# Patient Record
Sex: Female | Born: 1985
Health system: Southern US, Community
[De-identification: ages and names within clinical notes are randomized; demographics above are authoritative.]

## PROBLEM LIST (undated history)

## (undated) DIAGNOSIS — F909 Attention-deficit hyperactivity disorder, unspecified type: Secondary | ICD-10-CM

## (undated) DIAGNOSIS — F419 Anxiety disorder, unspecified: Secondary | ICD-10-CM

## (undated) DIAGNOSIS — F32A Depression, unspecified: Secondary | ICD-10-CM

## (undated) DIAGNOSIS — M1990 Unspecified osteoarthritis, unspecified site: Secondary | ICD-10-CM

## (undated) DIAGNOSIS — K589 Irritable bowel syndrome without diarrhea: Secondary | ICD-10-CM

## (undated) HISTORY — PX: HERNIA REPAIR: SHX51

---

## 2018-03-26 ENCOUNTER — Other Ambulatory Visit: Payer: Self-pay

## 2018-03-26 ENCOUNTER — Emergency Department (HOSPITAL_COMMUNITY): Payer: Self-pay

## 2018-03-26 ENCOUNTER — Encounter (HOSPITAL_COMMUNITY): Payer: Self-pay | Admitting: Emergency Medicine

## 2018-03-26 ENCOUNTER — Emergency Department (HOSPITAL_COMMUNITY)
Admission: EM | Admit: 2018-03-26 | Discharge: 2018-03-27 | Disposition: A | Discharge status: Home / Self Care | Payer: Self-pay | Attending: Emergency Medicine | Admitting: Emergency Medicine

## 2018-03-26 DIAGNOSIS — K5902 Outlet dysfunction constipation: Secondary | ICD-10-CM

## 2018-03-26 DIAGNOSIS — R102 Pelvic and perineal pain: Secondary | ICD-10-CM

## 2018-03-26 DIAGNOSIS — N83201 Unspecified ovarian cyst, right side: Secondary | ICD-10-CM

## 2018-03-26 HISTORY — DX: Irritable bowel syndrome, unspecified: K58.9

## 2018-03-26 LAB — URINALYSIS, ROUTINE W REFLEX MICROSCOPIC
Bilirubin Urine: NEGATIVE
Glucose, UA: NEGATIVE mg/dL
HGB URINE DIPSTICK: NEGATIVE
Ketones, ur: NEGATIVE mg/dL
Leukocytes, UA: NEGATIVE
Nitrite: NEGATIVE
Protein, ur: 30 mg/dL — AB
Specific Gravity, Urine: 1.021 (ref 1.005–1.030)
pH: 8 (ref 5.0–8.0)

## 2018-03-26 LAB — I-STAT BETA HCG BLOOD, ED (MC, WL, AP ONLY): I-stat hCG, quantitative: 5 m[IU]/mL — ABNORMAL HIGH (ref <5)

## 2018-03-26 LAB — COMPREHENSIVE METABOLIC PANEL
ALT: 11 U/L (ref 0–44)
AST: 16 U/L (ref 15–41)
Albumin: 3.8 g/dL (ref 3.5–5.0)
Alkaline Phosphatase: 59 U/L (ref 38–126)
Anion gap: 4 — ABNORMAL LOW (ref 5–15)
BUN: <5 mg/dL — ABNORMAL LOW (ref 6–20)
CO2: 27 mmol/L (ref 22–32)
Calcium: 8.8 mg/dL — ABNORMAL LOW (ref 8.9–10.3)
Chloride: 105 mmol/L (ref 98–111)
Creatinine, Ser: 1 mg/dL (ref 0.44–1.00)
GFR calc Af Amer: >60 mL/min (ref >60)
Glucose, Bld: 94 mg/dL (ref 70–99)
Potassium: 3.7 mmol/L (ref 3.5–5.1)
Sodium: 136 mmol/L (ref 135–145)
Total Bilirubin: 1.1 mg/dL (ref 0.3–1.2)
Total Protein: 7 g/dL (ref 6.5–8.1)

## 2018-03-26 LAB — CBC
HCT: 40.4 % (ref 36.0–46.0)
Hemoglobin: 12.8 g/dL (ref 12.0–15.0)
MCH: 27.2 pg (ref 26.0–34.0)
MCHC: 31.7 g/dL (ref 30.0–36.0)
MCV: 85.8 fL (ref 80.0–100.0)
Platelets: 280 10*3/uL (ref 150–400)
RBC: 4.71 MIL/uL (ref 3.87–5.11)
RDW: 14.2 % (ref 11.5–15.5)
WBC: 14.7 10*3/uL — AB (ref 4.0–10.5)
nRBC: 0 % (ref 0.0–0.2)

## 2018-03-26 LAB — WET PREP, GENITAL
Sperm: NONE SEEN
Trich, Wet Prep: NONE SEEN
Yeast Wet Prep HPF POC: NONE SEEN

## 2018-03-26 LAB — LIPASE, BLOOD: LIPASE: 19 U/L (ref 11–51)

## 2018-03-26 MED ORDER — AZITHROMYCIN 250 MG PO TABS
1000.0000 mg | ORAL_TABLET | Freq: Once | ORAL | Status: AC
  Administered 2018-03-27: 1000 mg via ORAL
  Filled 2018-03-26: qty 4

## 2018-03-26 MED ORDER — CEFTRIAXONE SODIUM 250 MG IJ SOLR
250.0000 mg | Freq: Once | INTRAMUSCULAR | Status: AC
  Administered 2018-03-27: 250 mg via INTRAMUSCULAR
  Filled 2018-03-26: qty 250

## 2018-03-26 MED ORDER — FENTANYL CITRATE (PF) 100 MCG/2ML IJ SOLN
50.0000 ug | Freq: Once | INTRAMUSCULAR | Status: AC
  Administered 2018-03-26: 50 ug via INTRAVENOUS

## 2018-03-26 MED ORDER — FENTANYL CITRATE (PF) 100 MCG/2ML IJ SOLN
INTRAMUSCULAR | Status: AC
  Filled 2018-03-26: qty 2

## 2018-03-26 MED ORDER — IOHEXOL 300 MG/ML  SOLN
100.0000 mL | Freq: Once | INTRAMUSCULAR | Status: AC | PRN
  Administered 2018-03-26: 100 mL via INTRAVENOUS

## 2018-03-26 MED ORDER — FENTANYL CITRATE (PF) 100 MCG/2ML IJ SOLN
50.0000 ug | Freq: Once | INTRAMUSCULAR | Status: AC
  Administered 2018-03-26: 50 ug via INTRAVENOUS
  Filled 2018-03-26: qty 2

## 2018-03-26 MED ORDER — ONDANSETRON HCL 4 MG/2ML IJ SOLN
4.0000 mg | Freq: Once | INTRAMUSCULAR | Status: AC
  Administered 2018-03-26: 4 mg via INTRAVENOUS
  Filled 2018-03-26: qty 2

## 2018-03-26 NOTE — ED Provider Notes (Signed)
[facility] EMERGENCY DEPARTMENT Provider Note   CSN: [id number] Arrival date & time: 03/26/18  1633     History   Chief Complaint Chief Complaint  Patient presents with  . Abdominal Pain    HPI Joanna Smith is a 32 y.o. female.  The history is provided by the patient. No language interpreter was used.   Joanna Smith is a 32 y.o. female who presents to the Emergency Department complaining of abdominal pain. He presents to the emergency department complaining of abdominal pain that began earlier today. She reports right lower quadrant pain described as dull and constant nature. At times it radiates down her right leg. Pain is worse with movement. He has associated nausea but no vomiting. She does report constipation for the last week. No fevers, vomiting, vaginal discharge. She is sexually active with her boyfriend but uses protection. She does have a history of recurrent BV and has been self treating at home with boric acid suppositories. She states that she gets severe yeast infections if she ever takes antibiotics. LMP two weeks ago Past Medical History:  Diagnosis Date  . IBS (irritable bowel syndrome)     There are no active problems to display for this patient.   Past Surgical History:  Procedure Laterality Date  . HERNIA REPAIR       OB History   No obstetric history on file.      Home Medications    Prior to Admission medications   Medication Sig Start Date End Date Taking? Authorizing Provider  doxycycline (VIBRAMYCIN) 100 MG capsule Take 1 capsule (100 mg total) by mouth 2 (two) times daily. 03/27/18   Quintella Reichert, MD  fluconazole (DIFLUCAN) 150 MG tablet Take 1 tablet (150 mg total) by mouth every 3 (three) days for 3 doses. 03/27/18 04/03/18  Quintella Reichert, MD  HYDROcodone-acetaminophen (NORCO/VICODIN) 5-325 MG tablet Take 1 tablet by mouth every 6 (six) hours as needed for severe pain. 03/27/18   Quintella Reichert, MD  ibuprofen  (ADVIL,MOTRIN) 800 MG tablet Take 1 tablet (800 mg total) by mouth every 8 (eight) hours as needed. 03/27/18   Quintella Reichert, MD  metroNIDAZOLE (FLAGYL) 500 MG tablet Take 1 tablet (500 mg total) by mouth 2 (two) times daily. 03/27/18   Quintella Reichert, MD  ondansetron (ZOFRAN) 4 MG tablet Take 1 tablet (4 mg total) by mouth every 8 (eight) hours as needed for nausea or vomiting. 03/27/18   Quintella Reichert, MD    Family History No family history on file.  Social History Social History   Tobacco Use  . Smoking status: Never Smoker  . Smokeless tobacco: Never Used  Substance Use Topics  . Alcohol use: Never    Frequency: Never  . Drug use: Yes    Types: Marijuana     Allergies   Macadamia nut oil   Review of Systems Review of Systems  All other systems reviewed and are negative.    Physical Exam Updated Vital Signs BP 96/74   Pulse 87   Temp 98.6 F (37 C) (Oral)   Resp 16   LMP 03/14/2018   SpO2 100%   Physical Exam Vitals signs and nursing note reviewed.  Constitutional:      Appearance: She is well-developed.  HENT:     Head: Normocephalic and atraumatic.  Cardiovascular:     Rate and Rhythm: Normal rate and regular rhythm.     Heart sounds: No murmur.  Pulmonary:     Effort: Pulmonary effort  is normal. No respiratory distress.     Breath sounds: Normal breath sounds.  Abdominal:     Palpations: Abdomen is soft.     Tenderness: There is no guarding or rebound.     Comments: Moderate right lower quadrant tenderness, voluntary guarding, no rebound.  Genitourinary:    Comments: Scan white vaginally discharge. No CMT. There is right adnexal tenderness. Musculoskeletal:        General: No tenderness.  Skin:    General: Skin is warm and dry.  Neurological:     Mental Status: She is alert and oriented to person, place, and time.  Psychiatric:        Behavior: Behavior normal.      ED Treatments / Results  Labs (all labs ordered are listed, but  only abnormal results are displayed) Labs Reviewed  WET PREP, GENITAL - Abnormal; Notable for the following components:      Result Value   Clue Cells Wet Prep HPF POC PRESENT (*)    WBC, Wet Prep HPF POC MODERATE (*)    All other components within normal limits  COMPREHENSIVE METABOLIC PANEL - Abnormal; Notable for the following components:   BUN <5 (*)    Calcium 8.8 (*)    Anion gap 4 (*)    All other components within normal limits  CBC - Abnormal; Notable for the following components:   WBC 14.7 (*)    All other components within normal limits  URINALYSIS, ROUTINE W REFLEX MICROSCOPIC - Abnormal; Notable for the following components:   APPearance HAZY (*)    Protein, ur 30 (*)    Bacteria, UA RARE (*)    All other components within normal limits  I-STAT BETA HCG BLOOD, ED ([facility], [facility], [facility] ONLY) - Abnormal; Notable for the following components:   I-stat hCG, quantitative 5.0 (*)    All other components within normal limits  LIPASE, BLOOD  RPR  HIV ANTIBODY (ROUTINE TESTING W REFLEX)  GC/CHLAMYDIA PROBE AMP ([place]) NOT AT [facility]    EKG None  Radiology US Transvaginal Non-ob  Result Date: 03/26/2018 CLINICAL DATA:  Initial evaluation for acute pelvic pain. EXAM: TRANSABDOMINAL AND TRANSVAGINAL ULTRASOUND OF PELVIS DOPPLER ULTRASOUND OF OVARIES TECHNIQUE: Both transabdominal and transvaginal ultrasound examinations of the pelvis were performed. Transabdominal technique was performed for global imaging of the pelvis including uterus, ovaries, adnexal regions, and pelvic cul-de-sac. It was necessary to proceed with endovaginal exam following the transabdominal exam to visualize the uterus, endometrium, and ovaries. Color and duplex Doppler ultrasound was utilized to evaluate blood flow to the ovaries. COMPARISON:  Prior CT from earlier the same day. FINDINGS: Uterus Measurements: 7.3 x 9.9 x 5.6 cm = volume: 214.7 mL. Multiple heterogeneous fibroid seen arising from the uterus. 6 cm  subserosal fibroid extends from the posterior uterine fundus. 5.9 cm subserosal fibroid extends anteriorly from the mid uterine body. Endometrium Thickness: 5.6 mm.  No focal abnormality visualized. Right ovary Not definitely visualized. Left ovary Measurements: 5.6 x 3.0 x 3.8 cm = volume: 35.3 mL. Few small simple anechoic cyst present, largest of which measures approximately 1.2 cm, most consistent with physiologic cysts/dominant follicles. Adjacent serpiginous tubular cystic structure most suggestive of a small hydrosalpinx (image 36). In the posterior pelvic cul-de-sac, a large complex cystic structure measuring up to 8.9 cm is seen, corresponding with cystic lesion on prior CT. This demonstrates diffuse homogeneous low-level internal echoes. No appreciable internal solid component. Unclear whether this arises from the ovaries, as this is  difficult to assess given size. Pulsed Doppler evaluation of the left ovary demonstrates normal low-resistance arterial and venous waveforms. The right ovary not visualized. Other findings No appreciable free fluid within the pelvis. IMPRESSION: 1. Large complex cystic mass measuring up to 8.9 cm within the posterior pelvic cul-de-sac. Finding is indeterminate, and not entirely clear whether this is ovarian in origin. Possible ovarian considerations would include a large complex or hemorrhagic cyst, endometrioma, or cystic ovarian neoplasm. Gynecologic referral for surgical consultation and further workup recommended. Additionally, further assessment with dedicated MRI may be helpful for further evaluation. 2. Additional mildly complex fluid-filled tubular structures within the left adnexa, most suggestive of hydrosalpinx. 3. No evidence for left-sided ovarian torsion. 4. Nonvisualization of the right ovary. 5. Fibroid uterus as above. Electronically Signed   By: Jeannine Boga M.D.   On: 03/26/2018 23:35   US Pelvis Complete  Result Date: 03/26/2018 CLINICAL DATA:   Initial evaluation for acute pelvic pain. EXAM: TRANSABDOMINAL AND TRANSVAGINAL ULTRASOUND OF PELVIS DOPPLER ULTRASOUND OF OVARIES TECHNIQUE: Both transabdominal and transvaginal ultrasound examinations of the pelvis were performed. Transabdominal technique was performed for global imaging of the pelvis including uterus, ovaries, adnexal regions, and pelvic cul-de-sac. It was necessary to proceed with endovaginal exam following the transabdominal exam to visualize the uterus, endometrium, and ovaries. Color and duplex Doppler ultrasound was utilized to evaluate blood flow to the ovaries. COMPARISON:  Prior CT from earlier the same day. FINDINGS: Uterus Measurements: 7.3 x 9.9 x 5.6 cm = volume: 214.7 mL. Multiple heterogeneous fibroid seen arising from the uterus. 6 cm subserosal fibroid extends from the posterior uterine fundus. 5.9 cm subserosal fibroid extends anteriorly from the mid uterine body. Endometrium Thickness: 5.6 mm.  No focal abnormality visualized. Right ovary Not definitely visualized. Left ovary Measurements: 5.6 x 3.0 x 3.8 cm = volume: 35.3 mL. Few small simple anechoic cyst present, largest of which measures approximately 1.2 cm, most consistent with physiologic cysts/dominant follicles. Adjacent serpiginous tubular cystic structure most suggestive of a small hydrosalpinx (image 36). In the posterior pelvic cul-de-sac, a large complex cystic structure measuring up to 8.9 cm is seen, corresponding with cystic lesion on prior CT. This demonstrates diffuse homogeneous low-level internal echoes. No appreciable internal solid component. Unclear whether this arises from the ovaries, as this is difficult to assess given size. Pulsed Doppler evaluation of the left ovary demonstrates normal low-resistance arterial and venous waveforms. The right ovary not visualized. Other findings No appreciable free fluid within the pelvis. IMPRESSION: 1. Large complex cystic mass measuring up to 8.9 cm within the  posterior pelvic cul-de-sac. Finding is indeterminate, and not entirely clear whether this is ovarian in origin. Possible ovarian considerations would include a large complex or hemorrhagic cyst, endometrioma, or cystic ovarian neoplasm. Gynecologic referral for surgical consultation and further workup recommended. Additionally, further assessment with dedicated MRI may be helpful for further evaluation. 2. Additional mildly complex fluid-filled tubular structures within the left adnexa, most suggestive of hydrosalpinx. 3. No evidence for left-sided ovarian torsion. 4. Nonvisualization of the right ovary. 5. Fibroid uterus as above. Electronically Signed   By: Jeannine Boga M.D.   On: 03/26/2018 23:35   Ct Abdomen Pelvis W Contrast  Result Date: 03/26/2018 CLINICAL DATA:  Abdominal pain, appendicitis suspected. EXAM: CT ABDOMEN AND PELVIS WITH CONTRAST TECHNIQUE: Multidetector CT imaging of the abdomen and pelvis was performed using the standard protocol following bolus administration of intravenous contrast. CONTRAST:  126mL OMNIPAQUE IOHEXOL 300 MG/ML  SOLN COMPARISON:  None.  FINDINGS: Lower chest: Normal heart size without pericardial effusion. Clear lung bases. Hepatobiliary: No focal liver abnormality is seen. No gallstones, gallbladder wall thickening, or biliary dilatation. Pancreas: No pancreatic ductal dilatation or inflammation. Spleen: Normal in size without focal abnormality. Adrenals/Urinary Tract: Adrenal glands are unremarkable. Kidneys are normal, without renal calculi, focal lesion, or hydronephrosis. Bladder is unremarkable. Stomach/Bowel: The appendix is visualized and is normal in caliber, series 3/56. No bowel obstruction or inflammation. Nondistended stomach with normal small bowel rotation. Vascular/Lymphatic: No adenopathy. Nonaneurysmal abdominal aorta. Reproductive: A complex cystic mass with thin septations is identified just ventral to the sacrum, seen within the cul-de-sac.  The dominant cystic component measures approximately 8.1 x 8.3 x 9.2 cm with smaller cystic foci projecting off the posterior caudal aspect. No solid mural nodularity is apparent. Tubular fluid-filled densities are noted in both adnexa suspicious for hydrosalpinges. Subserosal uterine leiomyomata are identified the largest is off the posterior aspect of the uterine fundus measuring 5.6 x 5.2 x 6.2 cm with a more heterogeneous subserosal uterine body leiomyoma measuring 7.7 x 4.5 x 5.5 cm. Other: No free air free fluid. Musculoskeletal: No acute nor aggressive osseous lesions. Slight disc space narrowing at L5-S1. IMPRESSION: 1. Complex cystic mass in the cul-de-sac measuring 8.1 x 8.3 x 9.2 cm with thin septations and smaller cystic foci projecting off the posterior caudal aspect. This is likely of ovarian etiology. Per consensus guidelines, ultrasound correlation is recommended. 2. Tubular fluid-filled densities in both adnexa suspicious for hydrosalpinges. 3. Fibroid uterus. 4. Normal appendix.  No bowel obstruction or inflammation. Electronically Signed   By: Ashley Royalty M.D.   On: 03/26/2018 19:33   [place] Art/ven Flow Abd Pelv Doppler  Result Date: 03/26/2018 CLINICAL DATA:  Initial evaluation for acute pelvic pain. EXAM: TRANSABDOMINAL AND TRANSVAGINAL ULTRASOUND OF PELVIS DOPPLER ULTRASOUND OF OVARIES TECHNIQUE: Both transabdominal and transvaginal ultrasound examinations of the pelvis were performed. Transabdominal technique was performed for global imaging of the pelvis including uterus, ovaries, adnexal regions, and pelvic cul-de-sac. It was necessary to proceed with endovaginal exam following the transabdominal exam to visualize the uterus, endometrium, and ovaries. Color and duplex Doppler ultrasound was utilized to evaluate blood flow to the ovaries. COMPARISON:  Prior CT from earlier the same day. FINDINGS: Uterus Measurements: 7.3 x 9.9 x 5.6 cm = volume: 214.7 mL. Multiple heterogeneous fibroid seen  arising from the uterus. 6 cm subserosal fibroid extends from the posterior uterine fundus. 5.9 cm subserosal fibroid extends anteriorly from the mid uterine body. Endometrium Thickness: 5.6 mm.  No focal abnormality visualized. Right ovary Not definitely visualized. Left ovary Measurements: 5.6 x 3.0 x 3.8 cm = volume: 35.3 mL. Few small simple anechoic cyst present, largest of which measures approximately 1.2 cm, most consistent with physiologic cysts/dominant follicles. Adjacent serpiginous tubular cystic structure most suggestive of a small hydrosalpinx (image 36). In the posterior pelvic cul-de-sac, a large complex cystic structure measuring up to 8.9 cm is seen, corresponding with cystic lesion on prior CT. This demonstrates diffuse homogeneous low-level internal echoes. No appreciable internal solid component. Unclear whether this arises from the ovaries, as this is difficult to assess given size. Pulsed Doppler evaluation of the left ovary demonstrates normal low-resistance arterial and venous waveforms. The right ovary not visualized. Other findings No appreciable free fluid within the pelvis. IMPRESSION: 1. Large complex cystic mass measuring up to 8.9 cm within the posterior pelvic cul-de-sac. Finding is indeterminate, and not entirely clear whether this is ovarian in origin. Possible ovarian considerations  would include a large complex or hemorrhagic cyst, endometrioma, or cystic ovarian neoplasm. Gynecologic referral for surgical consultation and further workup recommended. Additionally, further assessment with dedicated MRI may be helpful for further evaluation. 2. Additional mildly complex fluid-filled tubular structures within the left adnexa, most suggestive of hydrosalpinx. 3. No evidence for left-sided ovarian torsion. 4. Nonvisualization of the right ovary. 5. Fibroid uterus as above. Electronically Signed   By: Jeannine Boga M.D.   On: 03/26/2018 23:35    Procedures Procedures  (including critical care time)  Medications Ordered in ED Medications  fentaNYL (SUBLIMAZE) injection 50 mcg (50 mcg Intravenous Given 03/26/18 1843)  ondansetron (ZOFRAN) injection 4 mg (4 mg Intravenous Given 03/26/18 1843)  iohexol (OMNIPAQUE) 300 MG/ML solution 100 mL (100 mLs Intravenous Contrast Given 03/26/18 1901)  fentaNYL (SUBLIMAZE) injection 50 mcg (50 mcg Intravenous Given 03/26/18 2117)  azithromycin (ZITHROMAX) tablet 1,000 mg (1,000 mg Oral Given 03/27/18 0037)  cefTRIAXone (ROCEPHIN) injection 250 mg (250 mg Intramuscular Given 03/27/18 0038)  fluconazole (DIFLUCAN) tablet 150 mg (150 mg Oral Given 03/27/18 0046)     Initial Impression / Assessment and Plan / ED Course  I have reviewed the triage vital signs and the nursing notes.  Pertinent labs & imaging results that were available during my care of the patient were reviewed by me and considered in my medical decision making (see chart for details).     Patient here for evaluation of abdominal pain, nausea, constipation. She does have tenderness on examination but is non-toxic appearing. CT abdomen demonstrates a pelvic mass. Pelvic ultrasound with large cystic structure concerning for ovarian cyst. Pelvic examination with scant discharge. Discussed with OB/GYN on-call as well as patient findings of studies. OB/GYN recommends treatment for possible chronic PID with antibiotics. Discussed with patient concerns for infection and she is hesitant to take antibiotics due to a history of recurrent yeast infections, will provide Diflucan. Discussed home care for constipation. Discussed importance of outpatient follow-up as well as return precautions.  After discussion of sound findings patient does report that she has a history of pelvic/rectal cyst four years ago that was diagnosed at another hospital. She never received follow-up for this is that time.  Final Clinical Impressions(s) / ED Diagnoses   Final diagnoses:  Pelvic  pain  Constipation due to outlet dysfunction  Cyst of right ovary    ED Discharge Orders         Ordered    doxycycline (VIBRAMYCIN) 100 MG capsule  2 times daily     03/27/18 0026    metroNIDAZOLE (FLAGYL) 500 MG tablet  2 times daily     03/27/18 0026    ibuprofen (ADVIL,MOTRIN) 800 MG tablet  Every 8 hours PRN     03/27/18 0026    ondansetron (ZOFRAN) 4 MG tablet  Every 8 hours PRN     03/27/18 0026    HYDROcodone-acetaminophen (NORCO/VICODIN) 5-325 MG tablet  Every 6 hours PRN     03/27/18 0026    fluconazole (DIFLUCAN) 150 MG tablet  every 72 hours     03/27/18 0027           Quintella Reichert, MD 03/27/18 [phone]

## 2018-03-26 NOTE — ED Triage Notes (Signed)
Pt reports dull abdominal pain x 1 week. Pt reports she has not had a bowel movement/passed gas in 1 week. Pt reports nausea, vomiting, and chills starting today. Pt denies sick contact, reports hx of IBS and bowel blockage in past.

## 2018-03-27 ENCOUNTER — Telehealth: Payer: Self-pay | Admitting: Obstetrics and Gynecology

## 2018-03-27 LAB — GC/CHLAMYDIA PROBE AMP (~~LOC~~) NOT AT ARMC
Chlamydia: NEGATIVE
Neisseria Gonorrhea: NEGATIVE

## 2018-03-27 LAB — HIV ANTIBODY (ROUTINE TESTING W REFLEX): HIV SCREEN 4TH GENERATION: NONREACTIVE

## 2018-03-27 LAB — RPR: RPR Ser Ql: NONREACTIVE

## 2018-03-27 MED ORDER — METRONIDAZOLE 500 MG PO TABS: 500.0000 mg | ORAL_TABLET | Freq: Two times a day (BID) | ORAL | 0 refills | Status: DC

## 2018-03-27 MED ORDER — FLUCONAZOLE 150 MG PO TABS
150.0000 mg | ORAL_TABLET | Freq: Once | ORAL | Status: AC
  Administered 2018-03-27: 150 mg via ORAL
  Filled 2018-03-27: qty 1

## 2018-03-27 MED ORDER — ONDANSETRON HCL 4 MG PO TABS: 4.0000 mg | ORAL_TABLET | Freq: Three times a day (TID) | ORAL | 0 refills | Status: DC | PRN

## 2018-03-27 MED ORDER — DOXYCYCLINE HYCLATE 100 MG PO CAPS: 100.0000 mg | ORAL_CAPSULE | Freq: Two times a day (BID) | ORAL | 0 refills | Status: DC

## 2018-03-27 MED ORDER — FLUCONAZOLE 150 MG PO TABS: 150.0000 mg | ORAL_TABLET | ORAL | 0 refills | Status: AC

## 2018-03-27 MED ORDER — IBUPROFEN 800 MG PO TABS: 800.0000 mg | ORAL_TABLET | Freq: Three times a day (TID) | ORAL | 0 refills | Status: DC | PRN

## 2018-03-27 MED ORDER — HYDROCODONE-ACETAMINOPHEN 5-325 MG PO TABS: 1.0000 | ORAL_TABLET | Freq: Four times a day (QID) | ORAL | 0 refills | Status: DC | PRN

## 2018-03-27 NOTE — Telephone Encounter (Signed)
Informed patient of upcoming appointment @9 :15am via telephone call.

## 2018-03-27 NOTE — Discharge Instructions (Signed)
You had a CT scan and ultrasound today that demonstrated an 8.9 cm cyst on your right ovary. Please follow-up with the women's clinic for further evaluation. Get rechecked immediately if you develop severe fevers or new concerning symptoms.  You can take miralax, available over the counter for constipation.

## 2018-04-02 ENCOUNTER — Encounter: Payer: Self-pay | Admitting: Obstetrics and Gynecology

## 2018-04-02 ENCOUNTER — Ambulatory Visit (INDEPENDENT_AMBULATORY_CARE_PROVIDER_SITE_OTHER): Payer: Self-pay | Admitting: Obstetrics and Gynecology

## 2018-04-02 ENCOUNTER — Other Ambulatory Visit (HOSPITAL_COMMUNITY)
Admission: RE | Admit: 2018-04-02 | Discharge: 2018-04-02 | Disposition: A | Discharge status: Home / Self Care | Payer: Medicaid Other | Source: Ambulatory Visit | Attending: Obstetrics and Gynecology | Admitting: Obstetrics and Gynecology

## 2018-04-02 ENCOUNTER — Telehealth: Payer: Self-pay

## 2018-04-02 DIAGNOSIS — D219 Benign neoplasm of connective and other soft tissue, unspecified: Secondary | ICD-10-CM | POA: Insufficient documentation

## 2018-04-02 DIAGNOSIS — N83202 Unspecified ovarian cyst, left side: Secondary | ICD-10-CM | POA: Insufficient documentation

## 2018-04-02 DIAGNOSIS — D259 Leiomyoma of uterus, unspecified: Secondary | ICD-10-CM | POA: Diagnosis not present

## 2018-04-02 DIAGNOSIS — N83299 Other ovarian cyst, unspecified side: Secondary | ICD-10-CM | POA: Insufficient documentation

## 2018-04-02 NOTE — Progress Notes (Signed)
Joanna Smith is 33 yo G0 here for ER follow up for ovarian cyst and uterine fibroids noted on recent CT scan and U/S. Pt reports no pain today. Monthly cycles, last 3-4 days, no heavy or painful. Sexual active, condoms. H/O abnormal pap and colpo in the past. Last pap unsure Nulligravid + STD in teen yrs  PE AF VSS Lungs clear Heart RRR Abd soft + BS GU Nl EGBUS white vaginal discharge no lesion pap smear obtained uterus enlarged deviated to the left fullness noted in left adnexal  A/P Left Ovarian cyst        Uterine fibroids  Pap today for HM. Ovarian cyst and uterine fibroids reviewed with pt. Will check GYN U/S in 6-8 weeks. F/U after U/S to review results and additional treatment as indicated

## 2018-04-02 NOTE — Telephone Encounter (Signed)
Called Pt to inform of scheduled [place] appt. On 04/19/18 @ 9:30a & to arrive at 9:15am. Pt verbalized understanding.

## 2018-04-06 LAB — CYTOLOGY - PAP
Diagnosis: NEGATIVE
HPV: NOT DETECTED

## 2018-04-07 ENCOUNTER — Telehealth: Payer: Self-pay

## 2018-04-07 NOTE — Telephone Encounter (Signed)
-----   Message from Chancy Milroy, MD sent at 04/07/2018  2:15 PM EST ----- Please let pt know that her pap smear was normal Thanks Legrand Como

## 2018-04-07 NOTE — Telephone Encounter (Signed)
Notified pt that her pap results are normal.  Pt stated thank you with no further questions.

## 2018-04-09 ENCOUNTER — Ambulatory Visit (HOSPITAL_COMMUNITY)
Admission: RE | Admit: 2018-04-09 | Discharge: 2018-04-09 | Disposition: A | Discharge status: Home / Self Care | Payer: Self-pay | Source: Ambulatory Visit | Attending: Obstetrics and Gynecology | Admitting: Obstetrics and Gynecology

## 2018-04-09 DIAGNOSIS — D219 Benign neoplasm of connective and other soft tissue, unspecified: Secondary | ICD-10-CM | POA: Insufficient documentation

## 2018-04-09 DIAGNOSIS — N83299 Other ovarian cyst, unspecified side: Secondary | ICD-10-CM | POA: Insufficient documentation

## 2018-04-10 ENCOUNTER — Telehealth: Payer: Self-pay | Admitting: *Deleted

## 2018-04-10 NOTE — Telephone Encounter (Signed)
-----   Message from Chancy Milroy, MD sent at 04/10/2018  9:00 AM EST ----- Please let Ms Little know that the ovarian cyst has resolved. Fibroids remain. Keep follow up appt with me next month to discuss Thanks Legrand Como

## 2018-04-10 NOTE — Telephone Encounter (Signed)
Akhila called and left a voicemail at 11:52 stating she wanted to talk with Caryl Pina, the nurse she just spoke with- states still has a few questions.

## 2018-04-10 NOTE — Telephone Encounter (Signed)
Called pt to inform that her ovarian cyst has resolved and the fibroids remain and that she needs to keep her follow up appointment with Dr. Rip Harbour to discuss the plan for her fibroids.  Pt stated "how y'all going to tell me that my cyst is gone when I saw it myself yesterday on the ultrasound?! What are you going to do if I go to another doctor, in another city, and get another ultrasound and it shows the cyst?!" Advised pt that she is welcome to seek a second opinion and can let us know at a later time if she is interested in keeping her follow up appointment.  Pt stated that she "thinks we are playing her and trying to get rid of her by telling her there isn't a cyst when there is one and that we are just trying not to deal with this."  Reassured pt that this was not the case and that we take her concerns very seriously but the ultrasound does not show a cyst from the report given as well as from the MD review.  Pt verbalized understanding and stated that she would seek a second opinion.

## 2018-04-14 NOTE — Telephone Encounter (Signed)
Contacted pt and pt asked if she could a copy of her [place] results.  I advised pt that she can get them via Mychart she will just need to use a web browser and if she can't then she can fill out a release of information to obtain any of her records.  Pt stated understanding with on further questions.

## 2018-05-22 ENCOUNTER — Ambulatory Visit: Payer: Medicaid Other | Admitting: Obstetrics and Gynecology

## 2018-11-25 ENCOUNTER — Ambulatory Visit (HOSPITAL_COMMUNITY)
Admission: EM | Admit: 2018-11-25 | Discharge: 2018-11-25 | Disposition: A | Discharge status: Home / Self Care | Payer: Self-pay | Attending: Emergency Medicine | Admitting: Emergency Medicine

## 2018-11-25 ENCOUNTER — Other Ambulatory Visit: Payer: Self-pay

## 2018-11-25 ENCOUNTER — Encounter (HOSPITAL_COMMUNITY): Payer: Self-pay

## 2018-11-25 DIAGNOSIS — N61 Mastitis without abscess: Secondary | ICD-10-CM

## 2018-11-25 MED ORDER — IBUPROFEN 600 MG PO TABS: 600.0000 mg | ORAL_TABLET | Freq: Four times a day (QID) | ORAL | 0 refills | Status: DC | PRN

## 2018-11-25 MED ORDER — DOXYCYCLINE HYCLATE 100 MG PO CAPS: 100.0000 mg | ORAL_CAPSULE | Freq: Two times a day (BID) | ORAL | 0 refills | Status: AC

## 2018-11-25 NOTE — Discharge Instructions (Signed)
Finish the doxycycline, even if you feel better.  Warm compresses.  Feel free to return here if you are not better after finishing the antibiotics.  You may take 600 mg ibuprofen combined with 1 g Tylenol together 3-4 times a day as needed for pain.  Otherwise, follow-up with primary care physician choice, see list below.  Below is a list of primary care practices who are taking new patients for you to follow-up with.  [facility] [address] [place], [place] [address] [phone]  [place], [place] [address] [phone]  [facility] [phone] [place] [address]  [facility]: [place]  [phone]  [place] [facility]: [place]   [phone]  [facility]: [address] [place]  [phone]  [facility] : [address] [phone]  [facility]: [address] [place] [phone]  [facility]: [place] [phone]  Dr. Blanchie Serve [place]  [phone]  Dr. Benito Mccreedy, [facility]. [place], [place] [address]  [phone]  Go to www.goodrx.com to look up your medications. This will give you a list of where you can find your prescriptions at the most affordable prices. Or ask the pharmacist what the cash price is, or if they have any other discount programs available to help make your medication more affordable. This can be less expensive than what you would pay with  insurance.

## 2018-11-25 NOTE — ED Triage Notes (Signed)
Pt states her right nipple is draining and its very sore. X 3 days. Pt states the discharge is green, yellow and bloody in color.

## 2018-11-25 NOTE — ED Provider Notes (Signed)
HPI  SUBJECTIVE:  Joanna Smith is a 33 y.o. female who presents with several days of constant right nipple tenderness and pain.  She expressed odorous purulent thick yellow-green discharge from her nipple today, and the pain improved.  She states that she has been "leaking" serous drainage and blood after expressing the pus.  She reports that the nipple is swollen, and reports swelling in the left upper quadrant of her right breast.  There are no alleviating factors.  She has not tried anything for this.  Symptoms are worse with palpation.  She is not breast-feeding.  No fevers, body aches, axillary pain.  She has a nipple piercing that is several years old, is currently not wearing any jewelry.  She denies pus coming from the piercing tract.  Past medical history negative for diabetes, MRSA.  She does not take any medications on a regular basis.  LMP: 8/2.  Denies the possibility being pregnant.  PMD: None.    History reviewed. No pertinent past medical history.  History reviewed. No pertinent surgical history.  History reviewed. No pertinent family history.  Social History   Tobacco Use  . Smoking status: Never Smoker  . Smokeless tobacco: Never Used  Substance Use Topics  . Alcohol use: Not on file  . Drug use: Not on file    No current facility-administered medications for this encounter.   Current Outpatient Medications:  .  doxycycline (VIBRAMYCIN) 100 MG capsule, Take 1 capsule (100 mg total) by mouth 2 (two) times daily for 5 days., Disp: 10 capsule, Rfl: 0 .  ibuprofen (ADVIL) 600 MG tablet, Take 1 tablet (600 mg total) by mouth every 6 (six) hours as needed., Disp: 30 tablet, Rfl: 0  No Known Allergies   ROS  As noted in HPI.   Physical Exam  BP 111/66 (BP Location: Right Arm)   Pulse 88   Temp 97.7 F (36.5 C)   Resp 18   Wt 74.8 kg   LMP 11/01/2018   SpO2 99%   Constitutional: Well developed, well nourished, no acute distress Eyes:  EOMI, conjunctiva  normal bilaterally HENT: Normocephalic, atraumatic,mucus membranes moist Respiratory: Normal inspiratory effort Cardiovascular: Normal rate GI: nondistended skin: Breasts symmetric.  Mild tenderness in the left upper quadrant of the right breast.  No induration, erythema of the breast or of the nipple.  Scant expressible purulent discharge from the nipple.  No discharge from the piercing tract.  No palpable breast masses.  No skin changes.  Patient declined chaperone Lymph: No axillary lymphadenopathy right side Musculoskeletal: no deformities Neurologic: Alert & oriented x 3, no focal neuro deficits Psychiatric: Speech and behavior appropriate   ED Course   Medications - No data to display  No orders of the defined types were placed in this encounter.   No results found for this or any previous visit (from the past 24 hour(s)). No results found.  ED Clinical Impression  1. Nipple infection      ED Assessment/Plan  Suspect patient had a small abscess that she expressed.  However will send home with doxycycline, warm compresses, Tylenol/ibuprofen.  Doubt galactocele as she is not pregnant/breast-feeding nor is she on any psychiatric medications that would cause lactation.  Will provide primary care list for ongoing care.  May return here if not better in 5 days for reevaluation.  Discussed , MDM, treatment plan, and plan for follow-up with patient.. patient agrees with plan.   Meds ordered this encounter  Medications  . doxycycline (VIBRAMYCIN)  100 MG capsule    Sig: Take 1 capsule (100 mg total) by mouth 2 (two) times daily for 5 days.    Dispense:  10 capsule    Refill:  0  . ibuprofen (ADVIL) 600 MG tablet    Sig: Take 1 tablet (600 mg total) by mouth every 6 (six) hours as needed.    Dispense:  30 tablet    Refill:  0    *This clinic note was created using [employer]. Therefore, there may be occasional mistakes despite careful proofreading.   ?    Melynda Ripple, MD 11/26/18 [phone]

## 2018-11-26 ENCOUNTER — Encounter: Payer: Self-pay | Admitting: Obstetrics and Gynecology

## 2018-11-30 ENCOUNTER — Emergency Department (HOSPITAL_COMMUNITY)
Admission: EM | Admit: 2018-11-30 | Discharge: 2018-12-01 | Disposition: A | Discharge status: Home / Self Care | Payer: Medicaid Other | Attending: Emergency Medicine | Admitting: Emergency Medicine

## 2018-11-30 ENCOUNTER — Other Ambulatory Visit: Payer: Self-pay

## 2018-11-30 DIAGNOSIS — F1210 Cannabis abuse, uncomplicated: Secondary | ICD-10-CM | POA: Insufficient documentation

## 2018-11-30 DIAGNOSIS — R21 Rash and other nonspecific skin eruption: Secondary | ICD-10-CM

## 2018-11-30 NOTE — ED Triage Notes (Signed)
Patient c/o rash on torso that began about a week ago. Also c/o itching. Has been taking Benadryl, hydrocortisone, zyrtec, etc,  with no results.

## 2018-12-01 MED ORDER — HYDROXYZINE HCL 25 MG PO TABS: 25.0000 mg | ORAL_TABLET | Freq: Four times a day (QID) | ORAL | 0 refills | Status: DC

## 2018-12-01 NOTE — Discharge Instructions (Addendum)
Follow up with the dermatologist of your choice for further evaluation of nonspecific rash.

## 2018-12-01 NOTE — ED Provider Notes (Signed)
[facility] Provider Note   CSN: [id number] Arrival date & time: 11/30/18  2345     History   Chief Complaint Chief Complaint  Patient presents with  . Rash    HPI Joanna Smith is a 33 y.o. female.     Patient to ED for evaluation of itching rash to torso for 2 weeks. No one else in the house is affected. No fever, blisters, drainage. She is using oral and topical Benadryl, topical hydrocortisone, Zyrtec, all without relief and she feels the rash is worsening. No new medications. No SOB, wheezing, oral swelling or trouble swallowing.   The history is provided by the patient. No language interpreter was used.  Rash Associated symptoms: no fever, no shortness of breath and not wheezing     Past Medical History:  Diagnosis Date  . IBS (irritable bowel syndrome)     Patient Active Problem List   Diagnosis Date Noted  . Ovarian cyst, complex 04/02/2018  . Fibroids 04/02/2018    Past Surgical History:  Procedure Laterality Date  . HERNIA REPAIR       OB History   No obstetric history on file.      Home Medications    Prior to Admission medications   Medication Sig Start Date End Date Taking? Authorizing Provider  doxycycline (VIBRAMYCIN) 100 MG capsule Take 1 capsule (100 mg total) by mouth 2 (two) times daily. 03/27/18   Quintella Reichert, MD  HYDROcodone-acetaminophen (NORCO/VICODIN) 5-325 MG tablet Take 1 tablet by mouth every 6 (six) hours as needed for severe pain. Patient not taking: Reported on 04/02/2018 03/27/18   Quintella Reichert, MD  ibuprofen (ADVIL) 600 MG tablet Take 1 tablet (600 mg total) by mouth every 6 (six) hours as needed. 11/25/18   Melynda Ripple, MD  ibuprofen (ADVIL,MOTRIN) 800 MG tablet Take 1 tablet (800 mg total) by mouth every 8 (eight) hours as needed. 03/27/18   Quintella Reichert, MD  metroNIDAZOLE (FLAGYL) 500 MG tablet Take 1 tablet (500 mg total) by mouth 2 (two) times daily. 03/27/18   Quintella Reichert, MD  ondansetron (ZOFRAN) 4 MG tablet Take 1 tablet (4 mg total) by mouth every 8 (eight) hours as needed for nausea or vomiting. Patient not taking: Reported on 04/02/2018 03/27/18   Quintella Reichert, MD    Family History No family history on file.  Social History Social History   Tobacco Use  . Smoking status: Never Smoker  . Smokeless tobacco: Never Used  Substance Use Topics  . Alcohol use: Never    Frequency: Never  . Drug use: Yes    Types: Marijuana     Allergies   Macadamia nut oil   Review of Systems Review of Systems  Constitutional: Negative for fever.  HENT: Negative for trouble swallowing.   Respiratory: Negative for shortness of breath and wheezing.   Skin: Positive for rash.     Physical Exam Updated Vital Signs BP 120/85 (BP Location: Right Arm)   Pulse 70   Temp 98 F (36.7 C) (Oral)   Resp 18   Ht 5\' 2"  (1.575 m)   Wt 74.8 kg   LMP 11/01/2018   SpO2 100%   BMI 30.18 kg/m   Physical Exam Constitutional:      Appearance: She is well-developed.  Neck:     Musculoskeletal: Normal range of motion.  Pulmonary:     Effort: Pulmonary effort is normal.  Skin:    General: Skin is warm and dry.  Comments: Small, singular, raised minimally erythematous bumps generalized to back and abdomen/chest. There are few areas to proximal extremities. No herald patch. No confluence.  Neurological:     Mental Status: She is alert and oriented to person, place, and time.      ED Treatments / Results  Labs (all labs ordered are listed, but only abnormal results are displayed) Labs Reviewed - No data to display  EKG None  Radiology No results found.  Procedures Procedures (including critical care time)  Medications Ordered in ED Medications - No data to display   Initial Impression / Assessment and Plan / ED Course  I have reviewed the triage vital signs and the nursing notes.  Pertinent labs & imaging results that were available  during my care of the patient were reviewed by me and considered in my medical decision making (see chart for details).        Patient with nonspecific rash to torso x 2 weeks. Does not follow the pattern of allergy, pityriasis, scabies or infection.   Will Rx Atarax for symptoms and refer to dermatology  Final Clinical Impressions(s) / ED Diagnoses   Final diagnoses:  None   1. Nonspecific rash  ED Discharge Orders    None       Charlann Lange, PA-C 12/01/18 0144    Ripley Fraise, MD 12/01/18 [phone]

## 2019-01-11 ENCOUNTER — Other Ambulatory Visit: Payer: Self-pay

## 2019-01-11 ENCOUNTER — Emergency Department (HOSPITAL_COMMUNITY): Payer: Self-pay

## 2019-01-11 ENCOUNTER — Inpatient Hospital Stay (HOSPITAL_COMMUNITY)
Admission: EM | Admit: 2019-01-11 | Discharge: 2019-01-15 | DRG: 872 | Disposition: A | Discharge status: Home / Self Care | Payer: Self-pay | Attending: Internal Medicine | Admitting: Internal Medicine

## 2019-01-11 ENCOUNTER — Encounter (HOSPITAL_COMMUNITY): Payer: Self-pay | Admitting: *Deleted

## 2019-01-11 DIAGNOSIS — B9789 Other viral agents as the cause of diseases classified elsewhere: Secondary | ICD-10-CM | POA: Diagnosis present

## 2019-01-11 DIAGNOSIS — F1210 Cannabis abuse, uncomplicated: Secondary | ICD-10-CM | POA: Diagnosis present

## 2019-01-11 DIAGNOSIS — K5900 Constipation, unspecified: Secondary | ICD-10-CM | POA: Diagnosis present

## 2019-01-11 DIAGNOSIS — N730 Acute parametritis and pelvic cellulitis: Secondary | ICD-10-CM | POA: Diagnosis present

## 2019-01-11 DIAGNOSIS — J069 Acute upper respiratory infection, unspecified: Secondary | ICD-10-CM | POA: Diagnosis present

## 2019-01-11 DIAGNOSIS — D259 Leiomyoma of uterus, unspecified: Secondary | ICD-10-CM | POA: Diagnosis present

## 2019-01-11 DIAGNOSIS — N7093 Salpingitis and oophoritis, unspecified: Secondary | ICD-10-CM | POA: Diagnosis present

## 2019-01-11 DIAGNOSIS — Z7982 Long term (current) use of aspirin: Secondary | ICD-10-CM

## 2019-01-11 DIAGNOSIS — E876 Hypokalemia: Secondary | ICD-10-CM | POA: Diagnosis present

## 2019-01-11 DIAGNOSIS — Z87891 Personal history of nicotine dependence: Secondary | ICD-10-CM

## 2019-01-11 DIAGNOSIS — K2970 Gastritis, unspecified, without bleeding: Secondary | ICD-10-CM | POA: Diagnosis present

## 2019-01-11 DIAGNOSIS — Z20822 Contact with and (suspected) exposure to covid-19: Secondary | ICD-10-CM

## 2019-01-11 DIAGNOSIS — Z20828 Contact with and (suspected) exposure to other viral communicable diseases: Secondary | ICD-10-CM | POA: Diagnosis present

## 2019-01-11 DIAGNOSIS — N7011 Chronic salpingitis: Secondary | ICD-10-CM | POA: Diagnosis present

## 2019-01-11 DIAGNOSIS — A419 Sepsis, unspecified organism: Principal | ICD-10-CM | POA: Diagnosis present

## 2019-01-11 DIAGNOSIS — Z833 Family history of diabetes mellitus: Secondary | ICD-10-CM

## 2019-01-11 LAB — CBC WITH DIFFERENTIAL/PLATELET
Abs Immature Granulocytes: 0.16 10*3/uL — ABNORMAL HIGH (ref 0.00–0.07)
Basophils Absolute: 0.1 10*3/uL (ref 0.0–0.1)
Basophils Relative: 0 %
Eosinophils Absolute: 0 10*3/uL (ref 0.0–0.5)
Eosinophils Relative: 0 %
HCT: 42 % (ref 36.0–46.0)
Hemoglobin: 13.5 g/dL (ref 12.0–15.0)
Immature Granulocytes: 1 %
Lymphocytes Relative: 11 %
Lymphs Abs: 2.7 10*3/uL (ref 0.7–4.0)
MCH: 27.7 pg (ref 26.0–34.0)
MCHC: 32.1 g/dL (ref 30.0–36.0)
MCV: 86.1 fL (ref 80.0–100.0)
Monocytes Absolute: 2.3 10*3/uL — ABNORMAL HIGH (ref 0.1–1.0)
Monocytes Relative: 10 %
Neutro Abs: 18.7 10*3/uL — ABNORMAL HIGH (ref 1.7–7.7)
Neutrophils Relative %: 78 %
Platelets: 261 10*3/uL (ref 150–400)
RBC: 4.88 MIL/uL (ref 3.87–5.11)
RDW: 14.3 % (ref 11.5–15.5)
WBC: 23.9 10*3/uL — ABNORMAL HIGH (ref 4.0–10.5)
nRBC: 0 % (ref 0.0–0.2)

## 2019-01-11 LAB — URINALYSIS, ROUTINE W REFLEX MICROSCOPIC
Bacteria, UA: NONE SEEN
Bilirubin Urine: NEGATIVE
Glucose, UA: NEGATIVE mg/dL
Hgb urine dipstick: NEGATIVE
Ketones, ur: NEGATIVE mg/dL
Nitrite: NEGATIVE
Protein, ur: NEGATIVE mg/dL
Specific Gravity, Urine: 1.008 (ref 1.005–1.030)
pH: 7 (ref 5.0–8.0)

## 2019-01-11 LAB — RESPIRATORY PANEL BY PCR

## 2019-01-11 LAB — COMPREHENSIVE METABOLIC PANEL WITH GFR
ALT: 11 U/L (ref 0–44)
AST: 13 U/L — ABNORMAL LOW (ref 15–41)
Albumin: 3.5 g/dL (ref 3.5–5.0)
Alkaline Phosphatase: 72 U/L (ref 38–126)
Anion gap: 10 (ref 5–15)
BUN: 7 mg/dL (ref 6–20)
CO2: 22 mmol/L (ref 22–32)
Calcium: 8.6 mg/dL — ABNORMAL LOW (ref 8.9–10.3)
Chloride: 104 mmol/L (ref 98–111)
Creatinine, Ser: 0.73 mg/dL (ref 0.44–1.00)
GFR calc Af Amer: >60 mL/min
GFR calc non Af Amer: >60 mL/min
Glucose, Bld: 101 mg/dL — ABNORMAL HIGH (ref 70–99)
Potassium: 3.8 mmol/L (ref 3.5–5.1)
Sodium: 136 mmol/L (ref 135–145)
Total Bilirubin: 0.7 mg/dL (ref 0.3–1.2)
Total Protein: 7.1 g/dL (ref 6.5–8.1)

## 2019-01-11 LAB — WET PREP, GENITAL
Clue Cells Wet Prep HPF POC: NONE SEEN
Sperm: NONE SEEN
Trich, Wet Prep: NONE SEEN
Yeast Wet Prep HPF POC: NONE SEEN

## 2019-01-11 LAB — PREGNANCY, URINE: Preg Test, Ur: NEGATIVE

## 2019-01-11 LAB — SARS CORONAVIRUS 2 (TAT 6-24 HRS): SARS Coronavirus 2: NEGATIVE

## 2019-01-11 LAB — INFLUENZA PANEL BY PCR (TYPE A & B)
Influenza A By PCR: NEGATIVE
Influenza B By PCR: NEGATIVE

## 2019-01-11 LAB — LACTIC ACID, PLASMA: Lactic Acid, Venous: 0.7 mmol/L (ref 0.5–1.9)

## 2019-01-11 LAB — LIPASE, BLOOD: Lipase: 24 U/L (ref 11–51)

## 2019-01-11 MED ORDER — DOXYCYCLINE HYCLATE 100 MG PO TABS
100.0000 mg | ORAL_TABLET | Freq: Once | ORAL | Status: AC
  Administered 2019-01-11: 19:00:00 100 mg via ORAL
  Filled 2019-01-11: qty 1

## 2019-01-11 MED ORDER — SODIUM CHLORIDE 0.9 % IV BOLUS
1000.0000 mL | Freq: Once | INTRAVENOUS | Status: AC
  Administered 2019-01-11: 1000 mL via INTRAVENOUS

## 2019-01-11 MED ORDER — ONDANSETRON HCL 4 MG/2ML IJ SOLN
4.0000 mg | Freq: Once | INTRAMUSCULAR | Status: AC
  Administered 2019-01-11: 4 mg via INTRAVENOUS
  Filled 2019-01-11: qty 2

## 2019-01-11 MED ORDER — MORPHINE SULFATE (PF) 4 MG/ML IV SOLN
4.0000 mg | Freq: Once | INTRAVENOUS | Status: AC
  Administered 2019-01-11: 15:00:00 4 mg via INTRAVENOUS
  Filled 2019-01-11: qty 1

## 2019-01-11 MED ORDER — ONDANSETRON HCL 4 MG/2ML IJ SOLN
4.0000 mg | Freq: Four times a day (QID) | INTRAMUSCULAR | Status: DC | PRN
  Administered 2019-01-11 – 2019-01-13 (×5): 4 mg via INTRAVENOUS
  Filled 2019-01-11 (×7): qty 2

## 2019-01-11 MED ORDER — IOHEXOL 300 MG/ML  SOLN
100.0000 mL | Freq: Once | INTRAMUSCULAR | Status: AC | PRN
  Administered 2019-01-11: 100 mL via INTRAVENOUS

## 2019-01-11 MED ORDER — SODIUM CHLORIDE 0.9 % IV BOLUS
500.0000 mL | Freq: Once | INTRAVENOUS | Status: AC
  Administered 2019-01-11: 500 mL via INTRAVENOUS

## 2019-01-11 MED ORDER — BENZONATATE 100 MG PO CAPS
100.0000 mg | ORAL_CAPSULE | Freq: Once | ORAL | Status: AC
  Administered 2019-01-11: 100 mg via ORAL
  Filled 2019-01-11: qty 1

## 2019-01-11 MED ORDER — HYDROMORPHONE HCL 1 MG/ML IJ SOLN
0.5000 mg | INTRAMUSCULAR | Status: AC | PRN
  Administered 2019-01-11 (×2): 0.5 mg via INTRAVENOUS
  Filled 2019-01-11 (×2): qty 1

## 2019-01-11 MED ORDER — IOHEXOL 300 MG/ML  SOLN: 100.0000 mL | Freq: Once | INTRAMUSCULAR | Status: DC | PRN

## 2019-01-11 MED ORDER — SODIUM CHLORIDE 0.9 % IV SOLN
2.0000 g | Freq: Once | INTRAVENOUS | Status: AC
  Administered 2019-01-11: 19:00:00 2 g via INTRAVENOUS
  Filled 2019-01-11: qty 2

## 2019-01-11 MED ORDER — MORPHINE SULFATE (PF) 4 MG/ML IV SOLN
4.0000 mg | Freq: Once | INTRAVENOUS | Status: AC
  Administered 2019-01-11: 4 mg via INTRAVENOUS
  Filled 2019-01-11: qty 1

## 2019-01-11 NOTE — H&P (Signed)
Joanna Smith [id number] DOB: 1986-03-22 DOA: 01/11/2019     PCP: Patient, No Pcp Per   Outpatient Specialists:  NONE  *  Patient arrived to ER on 01/11/19 at 1111  Patient coming from: home Lives  With fiance    Chief Complaint:   Chief Complaint  Patient presents with   Abdominal Pain    HPI: Joanna Smith is a 33 y.o. female with   medical history significant  Of PID, TOA    Presented with   abdominal pain congestion no nausea vomiting no diarrhea has had significant cough for past 4 days occasionally productive of sputum she had initially cold-like symptoms last week but now settled into her chest.  She has occasional chest pain with coughing.  No leg swelling no hemoptysis no personal history of PE or DVT. Patient denied any sick contacts she try to use Zyrtec but did not seem to help Having fevers last night and feels like her abdomen right lower quadrant pain feels similar to when she had fluid in her fallopian tubes in the past no vaginal discharge  Patient initially stated to emergency department that [facility] virus testing has already virus on them and that she felt "set up because she is a black woman" for being tested for coronavirus She eventually agreed to get tested   Infectious risk factors:  Reports:  Fever,  cough, chest pain,  URI symptoms,  abdominal pain,      In  ER RAPID COVID TEST NEGATIVE   Lab Results  Component Value Date   [place] NEGATIVE 01/11/2019       While in ER: CT abdomen showed evidence of PID as well as left hydrosalpinx and 9.5 cm complex cystic lesion of pelvic cul-de-sac likely secondary to chronic left tubo-ovarian abscess OB/GYN was consulted requesting medicine to admit given respiratory symptoms although patient is COVID negative ER provider is concerned and would like patient to be retested in a.m. Later on respiratory panel came back with rhinovirus which is likely explaining patient's respiratory  symptoms   OB/GYN will see patient in consult recommend an ultrasound to further evaluate if patient will need aggressive surgical intervention IV antibiotics cefotaxime and doxycycline initiated in emergency The following Work up has been ordered so far:  Orders Placed This Encounter  Procedures   SARS CORONAVIRUS 2 (TAT 6-24 HRS) Nasopharyngeal Nasopharyngeal Swab   Wet prep, genital   Culture, blood (routine x 2)   Respiratory Panel by PCR   DG Chest Port 1 View   CT Abdomen Pelvis W Contrast   US Pelvis Complete   US Transvaginal Non-OB   US Art/Ven Flow Abd Pelv Doppler   Comprehensive metabolic panel   Lipase, blood   CBC with Differential   Urinalysis, Routine w reflex microscopic   Pregnancy, urine   Influenza panel by PCR (type A & B)   Cardiac monitoring   Check temperature   Pelvic cart to bedside   Consult to obstetrics / gynecology  ALL PATIENTS BEING ADMITTED/HAVING PROCEDURES NEED COVID-19 SCREENING   Consult to hospitalist  ALL PATIENTS BEING ADMITTED/HAVING PROCEDURES NEED COVID-19 SCREENING   Airborne and Contact precautions   Pulse oximetry, continuous   Saline lock IV     Following Medications were ordered in ER: Medications  iohexol (OMNIPAQUE) 300 MG/ML solution 100 mL ( Intravenous Canceled Entry 01/11/19 1656)  HYDROmorphone (DILAUDID) injection 0.5 mg (0.5 mg Intravenous Given 01/11/19 1806)  cefOXitin (MEFOXIN) 2 g in sodium chloride 0.9 %  100 mL IVPB (2 g Intravenous New Bag/Given 01/11/19 1901)  morphine 4 MG/ML injection 4 mg (4 mg Intravenous Given 01/11/19 1315)  ondansetron (ZOFRAN) injection 4 mg (4 mg Intravenous Given 01/11/19 1315)  sodium chloride 0.9 % bolus 500 mL (0 mLs Intravenous Stopped 01/11/19 1428)  benzonatate (TESSALON) capsule 100 mg (100 mg Oral Given 01/11/19 1527)  morphine 4 MG/ML injection 4 mg (4 mg Intravenous Given 01/11/19 1528)  iohexol (OMNIPAQUE) 300 MG/ML solution 100 mL (100 mLs  Intravenous Contrast Given 01/11/19 1656)  sodium chloride 0.9 % bolus 1,000 mL (1,000 mLs Intravenous New Bag/Given 01/11/19 1809)  doxycycline (VIBRA-TABS) tablet 100 mg (100 mg Oral Given 01/11/19 1857)        Consult Orders  (From admission, onward)         Start     Ordered   01/11/19 1826  Consult to hospitalist  ALL PATIENTS BEING ADMITTED/HAVING PROCEDURES NEED COVID-19 SCREENING  Once    Comments: ALL PATIENTS BEING ADMITTED/HAVING PROCEDURES NEED COVID-19 SCREENING  Provider:  (Not yet assigned)  Question Answer Comment  Place call to: [facility]   Reason for Consult Admit      01/11/19 1825          ER Provider Called:   OB/GYN   Dr. Rosana Hoes They Recommend admit to medicine   Will see in ER   Significant initial  Findings: Abnormal Labs Reviewed  WET PREP, GENITAL - Abnormal; Notable for the following components:      Result Value   WBC, Wet Prep HPF POC FEW (*)    All other components within normal limits  COMPREHENSIVE METABOLIC PANEL - Abnormal; Notable for the following components:   Glucose, Bld 101 (*)    Calcium 8.6 (*)    AST 13 (*)    All other components within normal limits  CBC WITH DIFFERENTIAL/PLATELET - Abnormal; Notable for the following components:   WBC 23.9 (*)    Neutro Abs 18.7 (*)    Monocytes Absolute 2.3 (*)    Abs Immature Granulocytes 0.16 (*)    All other components within normal limits  URINALYSIS, ROUTINE W REFLEX MICROSCOPIC - Abnormal; Notable for the following components:   APPearance HAZY (*)    Leukocytes,Ua TRACE (*)    All other components within normal limits     Otherwise labs showing:    Recent Labs  Lab 01/11/19 1324  NA 136  K 3.8  CO2 22  GLUCOSE 101*  BUN 7  CREATININE 0.73  CALCIUM 8.6*    Cr    stable,   Lab Results  Component Value Date   CREATININE 0.73 01/11/2019   CREATININE 1.00 03/26/2018    Recent Labs  Lab 01/11/19 1324  AST 13*  ALT 11  ALKPHOS 72  BILITOT 0.7  PROT  7.1  ALBUMIN 3.5   Lab Results  Component Value Date   CALCIUM 8.6 (L) 01/11/2019     WBC      Component Value Date/Time   WBC 23.9 (H) 01/11/2019 1324   ANC    Component Value Date/Time   NEUTROABS 18.7 (H) 01/11/2019 1324   ALC No components found for: LYMPHAB    Plt: Lab Results  Component Value Date   PLT 261 01/11/2019     Lactic Acid, Venous    Component Value Date/Time   LATICACIDVEN 0.7 01/11/2019 1324    Procalcitonin    Ordered   COVID-19 Labs    Lab Results  Component Value  Date   [place] NEGATIVE 01/11/2019     HG/HCT stable,     Component Value Date/Time   HGB 13.5 01/11/2019 1324   HCT 42.0 01/11/2019 1324    Recent Labs  Lab 01/11/19 1324  LIPASE 24   No results for input(s): AMMONIA in the last 168 hours.  No components found for: LABALBU      UA  no evidence of UTI    Urine analysis:    Component Value Date/Time   COLORURINE YELLOW 01/11/2019 1427   APPEARANCEUR HAZY (A) 01/11/2019 1427   LABSPEC 1.008 01/11/2019 1427   PHURINE 7.0 01/11/2019 1427   GLUCOSEU NEGATIVE 01/11/2019 [place] 01/11/2019 1427   BILIRUBINUR NEGATIVE 01/11/2019 1427   KETONESUR NEGATIVE 01/11/2019 1427   PROTEINUR NEGATIVE 01/11/2019 1427   NITRITE NEGATIVE 01/11/2019 1427   LEUKOCYTESUR TRACE (A) 01/11/2019 1427    Ordered    CXR -  NON acute  CTabd/pelvis - right hydrosalpinx with mild adjacent inflammatory changes, consistent with acute pelvic inflammatory disease.    ED Triage Vitals  Enc Vitals Group     BP 01/11/19 1118 (!) 147/84     Pulse Rate 01/11/19 1118 (!) 105     Resp 01/11/19 1118 16     Temp 01/11/19 1118 98.9 F (37.2 C)     Temp Source 01/11/19 1118 Oral     SpO2 01/11/19 1118 99 %     Weight 01/11/19 1143 160 lb (72.6 kg)     Height 01/11/19 1143 5\' 2"  (1.575 m)     Head Circumference --      Peak Flow --      Pain Score 01/11/19 1143 8     Pain Loc --      Pain Edu? --      Excl. in [place]?  --   TMAX(24)@       Latest  Blood pressure 117/66, pulse 93, temperature 97.8 F (36.6 C), temperature source Oral, resp. rate 14, height 5\' 2"  (1.575 m), weight 72.6 kg, last menstrual period 01/04/2019, SpO2 100 %.     Hospitalist was called for admission for PID and respiratory illness    Review of Systems:    Pertinent positives include:  Fevers, chills, fatigue,  abdominal pain, shortness of breath at rest.  productive cough, Constitutional:  No weight loss, night sweatsweight loss  HEENT:  No headaches, Difficulty swallowing,Tooth/dental problems,Sore throat,  No sneezing, itching, ear ache, nasal congestion, post nasal drip,  Cardio-vascular:  No chest pain, Orthopnea, PND, anasarca, dizziness, palpitations.no Bilateral lower extremity swelling  GI:  No heartburn, indigestion,  nausea, vomiting, diarrhea, change in bowel habits, loss of appetite, melena, blood in stool, hematemesis Resp:   No dyspnea on exertion, No excess mucus, no No non-productive cough, No coughing up of blood.No change in color of mucus.No wheezing. Skin:  no rash or lesions. No jaundice GU:  no dysuria, change in color of urine, no urgency or frequency. No straining to urinate.  No flank pain.  Musculoskeletal:  No joint pain or no joint swelling. No decreased range of motion. No back pain.  Psych:  No change in mood or affect. No depression or anxiety. No memory loss.  Neuro: no localizing neurological complaints, no tingling, no weakness, no double vision, no gait abnormality, no slurred speech, no confusion  All systems reviewed and apart from [place] all are negative  Past Medical History:   Past Medical History:  Diagnosis Date   IBS (  irritable bowel syndrome)       Past Surgical History:  Procedure Laterality Date   HERNIA REPAIR      Social History:  Ambulatory   independently       reports that she has been smoking. She has never used smokeless tobacco. She reports  previous drug use. Drug: Marijuana. She reports that she does not drink alcohol.   Family History:   Family History  Problem Relation Age of Onset   Diabetes Other     Allergies: Allergies  Allergen Reactions   Macadamia Nut Oil Anaphylaxis     Prior to Admission medications   Medication Sig Start Date End Date Taking? Authorizing Provider  doxycycline (VIBRAMYCIN) 100 MG capsule Take 1 capsule (100 mg total) by mouth 2 (two) times daily. 03/27/18   Quintella Reichert, MD  HYDROcodone-acetaminophen (NORCO/VICODIN) 5-325 MG tablet Take 1 tablet by mouth every 6 (six) hours as needed for severe pain. Patient not taking: Reported on 04/02/2018 03/27/18   Quintella Reichert, MD  hydrOXYzine (ATARAX/VISTARIL) 25 MG tablet Take 1 tablet (25 mg total) by mouth every 6 (six) hours. 12/01/18   Charlann Lange, PA-C  ibuprofen (ADVIL) 600 MG tablet Take 1 tablet (600 mg total) by mouth every 6 (six) hours as needed. 11/25/18   Melynda Ripple, MD  ibuprofen (ADVIL,MOTRIN) 800 MG tablet Take 1 tablet (800 mg total) by mouth every 8 (eight) hours as needed. 03/27/18   Quintella Reichert, MD  metroNIDAZOLE (FLAGYL) 500 MG tablet Take 1 tablet (500 mg total) by mouth 2 (two) times daily. 03/27/18   Quintella Reichert, MD  ondansetron (ZOFRAN) 4 MG tablet Take 1 tablet (4 mg total) by mouth every 8 (eight) hours as needed for nausea or vomiting. Patient not taking: Reported on 04/02/2018 03/27/18   Quintella Reichert, MD   Physical Exam: Blood pressure 117/66, pulse 93, temperature 97.8 F (36.6 C), temperature source Oral, resp. rate 14, height 5\' 2"  (1.575 m), weight 72.6 kg, last menstrual period 01/04/2019, SpO2 100 %. 1. General:  in No  Acute distress    Chronically ill -appearing 2. Psychological: Alert and   Oriented 3. Head/ENT:     Dry Mucous Membranes                          Head Non traumatic, neck supple                            Poor Dentition 4. SKIN:  decreased Skin turgor,  Skin clean Dry and  intact no rash 5. Heart: Regular rate and rhythm no  Murmur, no Rub or gallop 6. Lungs: no wheezes or crackles   7. Abdomen: Soft,  RLQ tenderness, Non distended  bowel sounds present 8. Lower extremities: no clubbing, cyanosis, no  edema 9. Neurologically Grossly intact, moving all 4 extremities equally   10. MSK: Normal range of motion   All other LABS:     Recent Labs  Lab 01/11/19 1324  WBC 23.9*  NEUTROABS 18.7*  HGB 13.5  HCT 42.0  MCV 86.1  PLT 261     Recent Labs  Lab 01/11/19 1324  NA 136  K 3.8  CL 104  CO2 22  GLUCOSE 101*  BUN 7  CREATININE 0.73  CALCIUM 8.6*     Recent Labs  Lab 01/11/19 1324  AST 13*  ALT 11  ALKPHOS 72  BILITOT 0.7  PROT 7.1  ALBUMIN 3.5       Cultures: No results found for: SDES, SPECREQUEST, CULT, REPTSTATUS   Radiological Exams on Admission: Ct Abdomen Pelvis W Contrast  Result Date: 01/11/2019 CLINICAL DATA:  Acute onset abdominal pain beginning last night. Suspected appendicitis. EXAM: CT ABDOMEN AND PELVIS WITH CONTRAST TECHNIQUE: Multidetector CT imaging of the abdomen and pelvis was performed using the standard protocol following bolus administration of intravenous contrast. CONTRAST:  111mL OMNIPAQUE IOHEXOL 300 MG/ML SOLN, <See Chart> OMNIPAQUE IOHEXOL 300 MG/ML SOLN COMPARISON:  03/26/2018 FINDINGS: Lower Chest: No acute findings. Hepatobiliary: No hepatic masses identified. Gallbladder is unremarkable. No evidence of biliary ductal dilatation. Pancreas:  No mass or inflammatory changes. Spleen: Within normal limits in size and appearance. Adrenals/Urinary Tract: No masses identified. No evidence of hydronephrosis. Stomach/Bowel: No evidence of obstruction, inflammatory process or abnormal fluid collections. Normal appendix visualized. Vascular/Lymphatic: No pathologically enlarged lymph nodes. No abdominal aortic aneurysm. Reproductive: Several uterine fibroids are again seen. Largest in the anterior lower uterine  segment measures 9 cm in maximum diameter. Another fibroid in the fundus measures 6 cm and shows diffuse hypervascular enhancement. Right-sided hydrosalpinx shows mild increase in size, with increased mural enhancement and mild adjacent inflammatory changes, consistent with tubo-ovarian abscess. Left hydrosalpinx shows no significant change. A complex cystic lesion which contains several enhancing internal septations is again seen in the pelvic cul-de-sac, currently measuring 9.5 x 7.7 cm compared to 9.2 x 7.9 cm previously. This likely represents a chronic left tubo-ovarian abscess, with cystic neoplasm of the left ovary considered less likely. No evidence of free fluid. Other:  None. Musculoskeletal:  No suspicious bone lesions identified. IMPRESSION: Mild increase in size of right hydrosalpinx with mild adjacent inflammatory changes, consistent with acute pelvic inflammatory disease. Stable left hydrosalpinx and 9.5 cm complex cystic lesion in pelvic cul-de-sac, likely representing chronic left tubo-ovarian abscess, with cystic neoplasm of left ovary considered less likely. Multiple uterine fibroids. No evidence of appendicitis. Electronically Signed   By: Marlaine Hind M.D.   On: 01/11/2019 17:22   Dg Chest Port 1 View  Result Date: 01/11/2019 CLINICAL DATA:  Tachycardia with cough and fevers EXAM: PORTABLE CHEST 1 VIEW COMPARISON:  None. FINDINGS: Lungs are clear. Heart size and pulmonary vascularity are normal. No adenopathy. No pneumothorax. No bone lesions. IMPRESSION: No edema or consolidation. Electronically Signed   By: Lowella Grip III M.D.   On: 01/11/2019 13:41    Chart has been reviewed    Assessment/Plan   32 y.o. female with   medical history significant  Of PID, TOA  Admitted for PID  Present on Admission:  PID (acute pelvic inflammatory disease) - continue Cefoxitin 2g q 6h  and doxycycline 100 BID [place] ordered , keep NPO post midnight in case need a procedure OB will  follow   Upper Respiratory illness - likely due to Rhinovirus, no evidence of PNA, Covid is negative supportive care  History of marijuana abuse.  Encourage patient to discontinue at this point she is not interested  Other plan as per orders.  DVT prophylaxis:  SCD    Code Status:  FULL CODE   as per patient   I had personally discussed CODE STATUS with patient   Family Communication:   Family not at  Bedside    Disposition Plan:     To home once workup is complete and patient is stable  Consults called: OB/GYN  Admission status:  ED Disposition    None       inpatient     Expect 2 midnight stay secondary to severity of patient's current illness including    Severe lab/radiological/exam abnormalities including:  PID   and extensive comorbidities including:   substance abuse    That are currently affecting medical management.   I expect  patient to be hospitalized for 2 midnights requiring inpatient medical care.  Patient is at high risk for adverse outcome (such as loss of life or disability) if not treated.  Indication for inpatient stay as follows:    severe pain requiring acute inpatient management,  inability to maintain oral hydration post midnight   Need for operative/procedural  intervention    Need for IV antibiotics, IV fluids,      Level of care    medical floor    Precautions:   Droplet precaution  PPE: Used by the provider:   P100  eye Goggles,  Gloves      [place] 01/11/2019, 9:32 PM *   [facility]     after 2 AM please page floor coverage PA If 7AM-7PM, please contact the day team taking care of the patient using [email or web address]

## 2019-01-11 NOTE — ED Notes (Signed)
ED TO INPATIENT HANDOFF REPORT  ED Nurse Name and Phone #:  Lonn Georgia [phone]  S Name/Age/Gender Joanna Smith 33 y.o. female Room/Bed: 010C/010C  Code Status   Code Status: Not on file  Home/SNF/Other Home Patient oriented to: self, place, time and situation Is this baseline? Yes   Triage Complete: Triage complete  Chief Complaint abd pain and congestion  Triage Note C/o abd. Pain onset last pm c/o congestion . Last BM yest. Denies urinary sx. Denies vomiting.    Allergies Allergies  Allergen Reactions  . Macadamia Nut Oil Anaphylaxis    Level of Care/Admitting Diagnosis ED Disposition    ED Disposition Condition Comment   Admit  Hospital Area: [facility] [[id number]]  Level of Care: Med-Surg [16]  Covid Evaluation: Person Under Investigation (PUI)  Diagnosis: PID (acute pelvic inflammatory disease) [id number]  Admitting Physician: Toy Baker [[id number]]  Attending Physician: Toy Baker [[id number]]  Estimated length of stay: 3 - 4 days  Certification:: I certify this patient will need inpatient services for at least 2 midnights  PT Class (Do Not Modify): Inpatient [101]  PT Acc Code (Do Not Modify): Private [1]       B Medical/Surgery History Past Medical History:  Diagnosis Date  . IBS (irritable bowel syndrome)    Past Surgical History:  Procedure Laterality Date  . HERNIA REPAIR       A IV Location/Drains/Wounds Patient Lines/Drains/Airways Status   Active Line/Drains/Airways    Name:   Placement date:   Placement time:   Site:   Days:   Peripheral IV 01/11/19 Right Antecubital   01/11/19    1320    Antecubital   less than 1          Intake/Output Last 24 hours  Intake/Output Summary (Last 24 hours) at 01/11/2019 2042 Last data filed at 01/11/2019 1428 Gross per 24 hour  Intake 500 ml  Output -  Net 500 ml    Labs/Imaging Results for orders placed or performed during the hospital encounter of 01/11/19 (from the  past 48 hour(s))  Comprehensive metabolic panel     Status: Abnormal   Collection Time: 01/11/19  1:24 PM  Result Value Ref Range   Sodium 136 135 - 145 mmol/L   Potassium 3.8 3.5 - 5.1 mmol/L   Chloride 104 98 - 111 mmol/L   CO2 22 22 - 32 mmol/L   Glucose, Bld 101 (H) 70 - 99 mg/dL   BUN 7 6 - 20 mg/dL   Creatinine, Ser 0.73 0.44 - 1.00 mg/dL   Calcium 8.6 (L) 8.9 - 10.3 mg/dL   Total Protein 7.1 6.5 - 8.1 g/dL   Albumin 3.5 3.5 - 5.0 g/dL   AST 13 (L) 15 - 41 U/L   ALT 11 0 - 44 U/L   Alkaline Phosphatase 72 38 - 126 U/L   Total Bilirubin 0.7 0.3 - 1.2 mg/dL   GFR calc non Af Amer >60 >60 mL/min   GFR calc Af Amer >60 >60 mL/min   Anion gap 10 5 - 15    Comment: Performed at [facility], [address] [place], [place] [address]  Lipase, blood     Status: None   Collection Time: 01/11/19  1:24 PM  Result Value Ref Range   Lipase 24 11 - 51 U/L    Comment: Performed at [facility] [address], [place], [place] [address]  CBC with Differential     Status: Abnormal  Collection Time: 01/11/19  1:24 PM  Result Value Ref Range   WBC 23.9 (H) 4.0 - 10.5 K/uL   RBC 4.88 3.87 - 5.11 MIL/uL   Hemoglobin 13.5 12.0 - 15.0 g/dL   HCT 42.0 36.0 - 46.0 %   MCV 86.1 80.0 - 100.0 fL   MCH 27.7 26.0 - 34.0 pg   MCHC 32.1 30.0 - 36.0 g/dL   RDW 14.3 11.5 - 15.5 %   Platelets 261 150 - 400 K/uL   nRBC 0.0 0.0 - 0.2 %   Neutrophils Relative % 78 %   Neutro Abs 18.7 (H) 1.7 - 7.7 K/uL   Lymphocytes Relative 11 %   Lymphs Abs 2.7 0.7 - 4.0 K/uL   Monocytes Relative 10 %   Monocytes Absolute 2.3 (H) 0.1 - 1.0 K/uL   Eosinophils Relative 0 %   Eosinophils Absolute 0.0 0.0 - 0.5 K/uL   Basophils Relative 0 %   Basophils Absolute 0.1 0.0 - 0.1 K/uL   Immature Granulocytes 1 %   Abs Immature Granulocytes 0.16 (H) 0.00 - 0.07 K/uL    Comment: Performed at [facility], [address], [place], [place] [address]  Lactic acid, plasma     Status: None   Collection  Time: 01/11/19  1:24 PM  Result Value Ref Range   Lactic Acid, Venous 0.7 0.5 - 1.9 mmol/L    Comment: Performed at [facility] [address], [place], [place] [address]  SARS CORONAVIRUS 2 (TAT 6-24 HRS) Nasopharyngeal Nasopharyngeal Swab     Status: None   Collection Time: 01/11/19  1:24 PM   Specimen: Nasopharyngeal Swab  Result Value Ref Range   SARS Coronavirus 2 NEGATIVE NEGATIVE    Comment: (NOTE) SARS-CoV-2 target nucleic acids are NOT DETECTED. The SARS-CoV-2 RNA is generally detectable in upper and lower respiratory specimens during the acute phase of infection. Negative results do not preclude SARS-CoV-2 infection, do not rule out co-infections with other pathogens, and should not be used as the sole basis for treatment or other patient management decisions. Negative results must be combined with clinical observations, patient history, and epidemiological information. The expected result is Negative. Fact Sheet for Patients: SugarRoll.be Fact Sheet for Healthcare Providers: https://www.woods-mathews.com/ This test is not yet approved or cleared by the [place] FDA and  has been authorized for detection and/or diagnosis of SARS-CoV-2 by FDA under an Emergency Use Authorization (EUA). This EUA will remain  in effect (meaning this test can be used) for the duration of the COVID-19 declaration under Section 56 4(b)(1) of the Act, 21 U.S.C. section 360bbb-3(b)(1), unless the authorization is terminated or revoked sooner. Performed at [facility], [place] [address], [place], [place] [address]   Urinalysis, Routine w reflex microscopic     Status: Abnormal   Collection Time: 01/11/19  2:27 PM  Result Value Ref Range   Color, Urine YELLOW YELLOW   APPearance HAZY (A) CLEAR   Specific Gravity, Urine 1.008 1.005 - 1.030   pH 7.0 5.0 - 8.0   Glucose, UA NEGATIVE NEGATIVE mg/dL   Hgb urine dipstick NEGATIVE NEGATIVE    Bilirubin Urine NEGATIVE NEGATIVE   Ketones, ur NEGATIVE NEGATIVE mg/dL   Protein, ur NEGATIVE NEGATIVE mg/dL   Nitrite NEGATIVE NEGATIVE   Leukocytes,Ua TRACE (A) NEGATIVE   RBC / HPF 0-5 0 - 5 RBC/hpf   WBC, UA 6-10 0 - 5 WBC/hpf   Bacteria, UA NONE SEEN NONE SEEN   Squamous  Epithelial / LPF 6-10 0 - 5   Mucus PRESENT     Comment: Performed at [facility], [place] [address], [place], [place] [address]  Pregnancy, urine     Status: None   Collection Time: 01/11/19  2:27 PM  Result Value Ref Range   Preg Test, Ur NEGATIVE NEGATIVE    Comment:        THE SENSITIVITY OF THIS METHODOLOGY IS >20 mIU/mL. Performed at [facility], [place] [address], [place], [place] [address]   Wet prep, genital     Status: Abnormal   Collection Time: 01/11/19  3:18 PM   Specimen: PATH Cytology Cervicovaginal Ancillary Only  Result Value Ref Range   Yeast Wet Prep HPF POC NONE SEEN NONE SEEN   Trich, Wet Prep NONE SEEN NONE SEEN   Clue Cells Wet Prep HPF POC NONE SEEN NONE SEEN   WBC, Wet Prep HPF POC FEW (A) NONE SEEN    Comment: Specimen diluted due to transport tube containing more than 1 ml of saline, interpret results with caution.   Sperm NONE SEEN     Comment: Performed at [facility], [place] [address], [place], [place] [address]  Respiratory Panel by PCR     Status: Abnormal   Collection Time: 01/11/19  7:02 PM   Specimen: Nasopharyngeal Swab; Respiratory  Result Value Ref Range   Adenovirus NOT DETECTED NOT DETECTED   Coronavirus 229E NOT DETECTED NOT DETECTED    Comment: (NOTE) The Coronavirus on the Respiratory Panel, DOES NOT test for the novel  Coronavirus (2019 nCoV)    Coronavirus HKU1 NOT DETECTED NOT DETECTED   Coronavirus NL63 NOT DETECTED NOT DETECTED   Coronavirus OC43 NOT DETECTED NOT DETECTED   Metapneumovirus NOT DETECTED NOT DETECTED   Rhinovirus / Enterovirus DETECTED (A) NOT DETECTED   Influenza A NOT DETECTED NOT DETECTED   Influenza B NOT DETECTED NOT  DETECTED   Parainfluenza Virus 1 NOT DETECTED NOT DETECTED   Parainfluenza Virus 2 NOT DETECTED NOT DETECTED   Parainfluenza Virus 3 NOT DETECTED NOT DETECTED   Parainfluenza Virus 4 NOT DETECTED NOT DETECTED   Respiratory Syncytial Virus NOT DETECTED NOT DETECTED   Bordetella pertussis NOT DETECTED NOT DETECTED   Chlamydophila pneumoniae NOT DETECTED NOT DETECTED   Mycoplasma pneumoniae NOT DETECTED NOT DETECTED    Comment: Performed at [facility], [place] [address] [place], [place] [address]  Influenza panel by PCR (type A & B)     Status: None   Collection Time: 01/11/19  7:02 PM  Result Value Ref Range   Influenza A By PCR NEGATIVE NEGATIVE   Influenza B By PCR NEGATIVE NEGATIVE    Comment: (NOTE) The Xpert Xpress Flu assay is intended as an aid in the diagnosis of  influenza and should not be used as a sole basis for treatment.  This  assay is FDA approved for nasopharyngeal swab specimens only. Nasal  washings and aspirates are unacceptable for Xpert Xpress Flu testing. Performed at [facility], [place] [address], [place],  [address]    US Transvaginal Non-ob  Result Date: 01/11/2019 CLINICAL DATA:  Abdominal pain with abnormal CT EXAM: TRANSABDOMINAL AND TRANSVAGINAL ULTRASOUND OF PELVIS DOPPLER ULTRASOUND OF OVARIES TECHNIQUE: Both transabdominal and transvaginal ultrasound examinations of the pelvis were performed. Transabdominal technique was performed for global imaging of the pelvis including uterus, ovaries, adnexal regions, and pelvic cul-de-sac. It was necessary to proceed with endovaginal exam following  the transabdominal exam to visualize the adnexa. Color and duplex Doppler ultrasound was utilized to evaluate blood flow to the ovaries. COMPARISON:  CT 01/11/2019, ultrasound 03/26/2018, CT 03/26/2018 FINDINGS: Uterus Measurements: 11.2 x 5.8 x 6.1 cm = volume: 204.5 mL. Multiple large uterine fibroids. Exophytic fundal fibroid measures 6.8 x 6 x 6.5 cm.  Pedunculated fibroid off the lower anterior uterine wall measures 6.2 x 4.5 x 5.7 cm. Endometrium Thickness: 6.3 mm.  No focal abnormality visualized. Right ovary Measurements: 2.7 x 1.8 x 2.2 cm = volume: 5.5 mL. Tortuous tubular structure in the right adnexa, felt consistent with hydrosalpinx. Slightly thickened wall and minimal scattered internal echoes. Left ovary Measurements: 5.4 x 2.5 x 3.6 cm = volume: 25.4 mL. Dilated tubular structure in the left adnexa with slightly thickened wall, felt consistent with hydrosalpinx. Few internal scattered echoes. Posterior to the cervix is a large complex cyst measuring 10.6 x 8.1 x 8.7 cm with flow at the periphery. Cyst contains internal scattered echoes and slightly echogenic debris. Previously this was more homogeneous and measured 8.1 x 8.9 by 7.5 cm. Pulsed Doppler evaluation of both ovaries demonstrates normal low-resistance arterial and venous waveforms. Other findings No abnormal free fluid. IMPRESSION: 1. Negative for ovarian torsion 2. Bilateral hydrosalpinx with slightly thickened wall and scattered internal echoes which could be secondary to acute inflammation/infection/acute PID. 3. Large 10.6 cm complex cyst posterior to the cervix, associated with left adnexa. Cyst appears more thick walled with less homogeneous internal echoes and is slightly increased in size. Mass remains indeterminate, but could represent tubo-ovarian abscess given inflammatory changes at the adnexa. 4. Large uterine fibroids Electronically Signed   By: Donavan Foil M.D.   On: 01/11/2019 20:10   US Pelvis Complete  Result Date: 01/11/2019 CLINICAL DATA:  Abdominal pain with abnormal CT EXAM: TRANSABDOMINAL AND TRANSVAGINAL ULTRASOUND OF PELVIS DOPPLER ULTRASOUND OF OVARIES TECHNIQUE: Both transabdominal and transvaginal ultrasound examinations of the pelvis were performed. Transabdominal technique was performed for global imaging of the pelvis including uterus, ovaries, adnexal  regions, and pelvic cul-de-sac. It was necessary to proceed with endovaginal exam following the transabdominal exam to visualize the adnexa. Color and duplex Doppler ultrasound was utilized to evaluate blood flow to the ovaries. COMPARISON:  CT 01/11/2019, ultrasound 03/26/2018, CT 03/26/2018 FINDINGS: Uterus Measurements: 11.2 x 5.8 x 6.1 cm = volume: 204.5 mL. Multiple large uterine fibroids. Exophytic fundal fibroid measures 6.8 x 6 x 6.5 cm. Pedunculated fibroid off the lower anterior uterine wall measures 6.2 x 4.5 x 5.7 cm. Endometrium Thickness: 6.3 mm.  No focal abnormality visualized. Right ovary Measurements: 2.7 x 1.8 x 2.2 cm = volume: 5.5 mL. Tortuous tubular structure in the right adnexa, felt consistent with hydrosalpinx. Slightly thickened wall and minimal scattered internal echoes. Left ovary Measurements: 5.4 x 2.5 x 3.6 cm = volume: 25.4 mL. Dilated tubular structure in the left adnexa with slightly thickened wall, felt consistent with hydrosalpinx. Few internal scattered echoes. Posterior to the cervix is a large complex cyst measuring 10.6 x 8.1 x 8.7 cm with flow at the periphery. Cyst contains internal scattered echoes and slightly echogenic debris. Previously this was more homogeneous and measured 8.1 x 8.9 by 7.5 cm. Pulsed Doppler evaluation of both ovaries demonstrates normal low-resistance arterial and venous waveforms. Other findings No abnormal free fluid. IMPRESSION: 1. Negative for ovarian torsion 2. Bilateral hydrosalpinx with slightly thickened wall and scattered internal echoes which could be secondary to acute inflammation/infection/acute PID. 3. Large 10.6 cm complex cyst posterior  to the cervix, associated with left adnexa. Cyst appears more thick walled with less homogeneous internal echoes and is slightly increased in size. Mass remains indeterminate, but could represent tubo-ovarian abscess given inflammatory changes at the adnexa. 4. Large uterine fibroids Electronically  Signed   By: Donavan Foil M.D.   On: 01/11/2019 20:10   Ct Abdomen Pelvis W Contrast  Result Date: 01/11/2019 CLINICAL DATA:  Acute onset abdominal pain beginning last night. Suspected appendicitis. EXAM: CT ABDOMEN AND PELVIS WITH CONTRAST TECHNIQUE: Multidetector CT imaging of the abdomen and pelvis was performed using the standard protocol following bolus administration of intravenous contrast. CONTRAST:  170mL OMNIPAQUE IOHEXOL 300 MG/ML SOLN, <See Chart> OMNIPAQUE IOHEXOL 300 MG/ML SOLN COMPARISON:  03/26/2018 FINDINGS: Lower Chest: No acute findings. Hepatobiliary: No hepatic masses identified. Gallbladder is unremarkable. No evidence of biliary ductal dilatation. Pancreas:  No mass or inflammatory changes. Spleen: Within normal limits in size and appearance. Adrenals/Urinary Tract: No masses identified. No evidence of hydronephrosis. Stomach/Bowel: No evidence of obstruction, inflammatory process or abnormal fluid collections. Normal appendix visualized. Vascular/Lymphatic: No pathologically enlarged lymph nodes. No abdominal aortic aneurysm. Reproductive: Several uterine fibroids are again seen. Largest in the anterior lower uterine segment measures 9 cm in maximum diameter. Another fibroid in the fundus measures 6 cm and shows diffuse hypervascular enhancement. Right-sided hydrosalpinx shows mild increase in size, with increased mural enhancement and mild adjacent inflammatory changes, consistent with tubo-ovarian abscess. Left hydrosalpinx shows no significant change. A complex cystic lesion which contains several enhancing internal septations is again seen in the pelvic cul-de-sac, currently measuring 9.5 x 7.7 cm compared to 9.2 x 7.9 cm previously. This likely represents a chronic left tubo-ovarian abscess, with cystic neoplasm of the left ovary considered less likely. No evidence of free fluid. Other:  None. Musculoskeletal:  No suspicious bone lesions identified. IMPRESSION: Mild increase in  size of right hydrosalpinx with mild adjacent inflammatory changes, consistent with acute pelvic inflammatory disease. Stable left hydrosalpinx and 9.5 cm complex cystic lesion in pelvic cul-de-sac, likely representing chronic left tubo-ovarian abscess, with cystic neoplasm of left ovary considered less likely. Multiple uterine fibroids. No evidence of appendicitis. Electronically Signed   By: Marlaine Hind M.D.   On: 01/11/2019 17:22   [place] Art/ven Flow Abd Pelv Doppler  Result Date: 01/11/2019 CLINICAL DATA:  Abdominal pain with abnormal CT EXAM: TRANSABDOMINAL AND TRANSVAGINAL ULTRASOUND OF PELVIS DOPPLER ULTRASOUND OF OVARIES TECHNIQUE: Both transabdominal and transvaginal ultrasound examinations of the pelvis were performed. Transabdominal technique was performed for global imaging of the pelvis including uterus, ovaries, adnexal regions, and pelvic cul-de-sac. It was necessary to proceed with endovaginal exam following the transabdominal exam to visualize the adnexa. Color and duplex Doppler ultrasound was utilized to evaluate blood flow to the ovaries. COMPARISON:  CT 01/11/2019, ultrasound 03/26/2018, CT 03/26/2018 FINDINGS: Uterus Measurements: 11.2 x 5.8 x 6.1 cm = volume: 204.5 mL. Multiple large uterine fibroids. Exophytic fundal fibroid measures 6.8 x 6 x 6.5 cm. Pedunculated fibroid off the lower anterior uterine wall measures 6.2 x 4.5 x 5.7 cm. Endometrium Thickness: 6.3 mm.  No focal abnormality visualized. Right ovary Measurements: 2.7 x 1.8 x 2.2 cm = volume: 5.5 mL. Tortuous tubular structure in the right adnexa, felt consistent with hydrosalpinx. Slightly thickened wall and minimal scattered internal echoes. Left ovary Measurements: 5.4 x 2.5 x 3.6 cm = volume: 25.4 mL. Dilated tubular structure in the left adnexa with slightly thickened wall, felt consistent with hydrosalpinx. Few internal scattered echoes. Posterior to the  cervix is a large complex cyst measuring 10.6 x 8.1 x 8.7 cm with  flow at the periphery. Cyst contains internal scattered echoes and slightly echogenic debris. Previously this was more homogeneous and measured 8.1 x 8.9 by 7.5 cm. Pulsed Doppler evaluation of both ovaries demonstrates normal low-resistance arterial and venous waveforms. Other findings No abnormal free fluid. IMPRESSION: 1. Negative for ovarian torsion 2. Bilateral hydrosalpinx with slightly thickened wall and scattered internal echoes which could be secondary to acute inflammation/infection/acute PID. 3. Large 10.6 cm complex cyst posterior to the cervix, associated with left adnexa. Cyst appears more thick walled with less homogeneous internal echoes and is slightly increased in size. Mass remains indeterminate, but could represent tubo-ovarian abscess given inflammatory changes at the adnexa. 4. Large uterine fibroids Electronically Signed   By: Donavan Foil M.D.   On: 01/11/2019 20:10   Dg Chest Port 1 View  Result Date: 01/11/2019 CLINICAL DATA:  Tachycardia with cough and fevers EXAM: PORTABLE CHEST 1 VIEW COMPARISON:  None. FINDINGS: Lungs are clear. Heart size and pulmonary vascularity are normal. No adenopathy. No pneumothorax. No bone lesions. IMPRESSION: No edema or consolidation. Electronically Signed   By: Lowella Grip III M.D.   On: 01/11/2019 13:41    Pending Labs Unresulted Labs (From admission, onward)    Start     Ordered   01/11/19 1735  Culture, blood (routine x 2)  BLOOD CULTURE X 2,   STAT     01/11/19 1736          Vitals/Pain Today's Vitals   01/11/19 1530 01/11/19 1815 01/11/19 1902 01/11/19 1903  BP: 125/72 117/66    Pulse: 95 93    Resp: 16 14    Temp:      TempSrc:      SpO2: 100% 100%    Weight:      Height:      PainSc:   5  5     Isolation Precautions Airborne and Contact precautions  Medications Medications  iohexol (OMNIPAQUE) 300 MG/ML solution 100 mL ( Intravenous Canceled Entry 01/11/19 1656)  HYDROmorphone (DILAUDID) injection 0.5 mg  (0.5 mg Intravenous Given 01/11/19 1806)  morphine 4 MG/ML injection 4 mg (4 mg Intravenous Given 01/11/19 1315)  ondansetron (ZOFRAN) injection 4 mg (4 mg Intravenous Given 01/11/19 1315)  sodium chloride 0.9 % bolus 500 mL (0 mLs Intravenous Stopped 01/11/19 1428)  benzonatate (TESSALON) capsule 100 mg (100 mg Oral Given 01/11/19 1527)  morphine 4 MG/ML injection 4 mg (4 mg Intravenous Given 01/11/19 1528)  iohexol (OMNIPAQUE) 300 MG/ML solution 100 mL (100 mLs Intravenous Contrast Given 01/11/19 1656)  sodium chloride 0.9 % bolus 1,000 mL (1,000 mLs Intravenous New Bag/Given 01/11/19 1809)  cefOXitin (MEFOXIN) 2 g in sodium chloride 0.9 % 100 mL IVPB (0 g Intravenous Stopped 01/11/19 2040)  doxycycline (VIBRA-TABS) tablet 100 mg (100 mg Oral Given 01/11/19 1857)    Mobility walks Low fall risk   Focused Assessments    R Recommendations: See Admitting Provider Note  Report given to:   Additional Notes:

## 2019-01-11 NOTE — ED Provider Notes (Signed)
[facility] EMERGENCY DEPARTMENT Provider Note   CSN: [id number] Arrival date & time: 01/11/19  1111     History   Chief Complaint Chief Complaint  Patient presents with   Abdominal Pain    HPI Joanna Smith is a 33 y.o. female who presents today for evaluation of multiple complaints. 1 cough. She reports that for the past 4 days she has had cough that is occasionally productive.  This started out is postnasal drip about a week ago however has now "settled into her chest."  She denies any chest pain unless she is coughing.  She denies any hormone use.  No recent leg swelling, surgery, hemoptysis.  No personal history of PE/DVT. She denies any known sick contacts, stating that her significant other does not have symptoms.  She did try Zyrtec 1 day without significant relief.  2.  Right lower abdominal pain she has a history of fibroids and ovarian cyst.  She reports that since last night she has had right lower quadrant pelvic pain.  She reports that this feels like the last time when she "had fluid in her fallopian tubes."  She reports subjective fevers last night.  She denies any movement or radiation of the pain.  No nausea or vomiting.  Last bowel movement was yesterday which was normal for her.  She denies any abnormal vaginal discharge.  When I discussed the possibility of coronavirus testing with the patient she stated "I feel like you are setting me up for this as I am a black woman."  She also stated that she believed that coronavirus test have COVID already on them from reading that she has done online.     HPI  Past Medical History:  Diagnosis Date   IBS (irritable bowel syndrome)     Patient Active Problem List   Diagnosis Date Noted   PID (acute pelvic inflammatory disease) 01/11/2019   Ovarian cyst, complex 04/02/2018   Fibroids 04/02/2018    Past Surgical History:  Procedure Laterality Date   HERNIA REPAIR       OB History   No  obstetric history on file.      Home Medications    Prior to Admission medications   Medication Sig Start Date End Date Taking? Authorizing Provider  aspirin EC 81 MG tablet Take 81 mg by mouth every 6 (six) hours as needed for mild pain.    Yes [provider]  ibuprofen (ADVIL) 600 MG tablet Take 1 tablet (600 mg total) by mouth every 6 (six) hours as needed. Patient taking differently: Take 600 mg by mouth every 6 (six) hours as needed for moderate pain.  11/25/18  Yes Melynda Ripple, MD    Family History Family History  Problem Relation Age of Onset   Diabetes Other     Social History Social History   Tobacco Use   Smoking status: Former Smoker   Smokeless tobacco: Never Used  Substance Use Topics   Alcohol use: Never    Frequency: Never   Drug use: Yes    Types: Marijuana     Allergies   Macadamia nut oil   Review of Systems Review of Systems  Constitutional: Positive for fatigue and fever. Negative for chills.  HENT: Positive for congestion and postnasal drip. Negative for sore throat, trouble swallowing and voice change.   Respiratory: Positive for cough. Negative for chest tightness, shortness of breath and wheezing.   Cardiovascular: Negative for chest pain.  Gastrointestinal: Positive for abdominal  pain. Negative for diarrhea, nausea and vomiting.  Genitourinary: Positive for pelvic pain. Negative for dysuria, frequency, menstrual problem, urgency, vaginal bleeding, vaginal discharge and vaginal pain.  Musculoskeletal: Negative for back pain and neck pain.  Neurological: Negative for weakness and headaches.  All other systems reviewed and are negative.    Physical Exam Updated Vital Signs BP 132/87    Pulse 95    Temp 97.8 F (36.6 C) (Oral)    Resp 14    Ht 5\' 2"  (1.575 m)    Wt 72.6 kg    LMP 01/04/2019 (Approximate)    SpO2 100%    BMI 29.26 kg/m   Physical Exam Vitals signs and nursing note reviewed. Exam conducted with a  chaperone present (Female RN).  Constitutional:      General: She is not in acute distress.    Appearance: She is well-developed.  HENT:     Head: Normocephalic and atraumatic.  Eyes:     Conjunctiva/sclera: Conjunctivae normal.  Neck:     Musculoskeletal: Neck supple.  Cardiovascular:     Rate and Rhythm: Normal rate and regular rhythm.     Heart sounds: No murmur.  Pulmonary:     Effort: Pulmonary effort is normal. No respiratory distress.     Breath sounds: Normal breath sounds.     Comments: Coughs frequently during exam. Abdominal:     General: Abdomen is flat. There is no distension.     Palpations: Abdomen is soft.     Tenderness: There is abdominal tenderness in the right lower quadrant. There is rebound. There is no right CVA tenderness or left CVA tenderness.     Hernia: No hernia is present.  Genitourinary:    Cervix: No cervical motion tenderness.     Uterus: Normal. Not deviated.      Adnexa: Left adnexa normal.       Right: Tenderness present.        Left: No mass, tenderness or fullness.       Comments: Patient was unable to tolerate speculum exam due to pain. Skin:    General: Skin is warm and dry.  Neurological:     General: No focal deficit present.     Mental Status: She is alert.  Psychiatric:        Mood and Affect: Mood normal.      ED Treatments / Results  Labs (all labs ordered are listed, but only abnormal results are displayed) Labs Reviewed  WET PREP, GENITAL - Abnormal; Notable for the following components:      Result Value   WBC, Wet Prep HPF POC FEW (*)    All other components within normal limits  RESPIRATORY PANEL BY PCR - Abnormal; Notable for the following components:   Rhinovirus / Enterovirus DETECTED (*)    All other components within normal limits  COMPREHENSIVE METABOLIC PANEL - Abnormal; Notable for the following components:   Glucose, Bld 101 (*)    Calcium 8.6 (*)    AST 13 (*)    All other components within normal  limits  CBC WITH DIFFERENTIAL/PLATELET - Abnormal; Notable for the following components:   WBC 23.9 (*)    Neutro Abs 18.7 (*)    Monocytes Absolute 2.3 (*)    Abs Immature Granulocytes 0.16 (*)    All other components within normal limits  URINALYSIS, ROUTINE W REFLEX MICROSCOPIC - Abnormal; Notable for the following components:   APPearance HAZY (*)    Leukocytes,Ua TRACE (*)  All other components within normal limits  SARS CORONAVIRUS 2 (TAT 6-24 HRS)  CULTURE, BLOOD (ROUTINE X 2)  CULTURE, BLOOD (ROUTINE X 2)  LIPASE, BLOOD  PREGNANCY, URINE  LACTIC ACID, PLASMA  INFLUENZA PANEL BY PCR (TYPE A & B)  RAPID URINE DRUG SCREEN, HOSP PERFORMED  GC/CHLAMYDIA PROBE AMP ([place]) NOT AT [facility]    EKG None  Radiology US Transvaginal Non-ob  Result Date: 01/11/2019 CLINICAL DATA:  Abdominal pain with abnormal CT EXAM: TRANSABDOMINAL AND TRANSVAGINAL ULTRASOUND OF PELVIS DOPPLER ULTRASOUND OF OVARIES TECHNIQUE: Both transabdominal and transvaginal ultrasound examinations of the pelvis were performed. Transabdominal technique was performed for global imaging of the pelvis including uterus, ovaries, adnexal regions, and pelvic cul-de-sac. It was necessary to proceed with endovaginal exam following the transabdominal exam to visualize the adnexa. Color and duplex Doppler ultrasound was utilized to evaluate blood flow to the ovaries. COMPARISON:  CT 01/11/2019, ultrasound 03/26/2018, CT 03/26/2018 FINDINGS: Uterus Measurements: 11.2 x 5.8 x 6.1 cm = volume: 204.5 mL. Multiple large uterine fibroids. Exophytic fundal fibroid measures 6.8 x 6 x 6.5 cm. Pedunculated fibroid off the lower anterior uterine wall measures 6.2 x 4.5 x 5.7 cm. Endometrium Thickness: 6.3 mm.  No focal abnormality visualized. Right ovary Measurements: 2.7 x 1.8 x 2.2 cm = volume: 5.5 mL. Tortuous tubular structure in the right adnexa, felt consistent with hydrosalpinx. Slightly thickened wall and minimal scattered  internal echoes. Left ovary Measurements: 5.4 x 2.5 x 3.6 cm = volume: 25.4 mL. Dilated tubular structure in the left adnexa with slightly thickened wall, felt consistent with hydrosalpinx. Few internal scattered echoes. Posterior to the cervix is a large complex cyst measuring 10.6 x 8.1 x 8.7 cm with flow at the periphery. Cyst contains internal scattered echoes and slightly echogenic debris. Previously this was more homogeneous and measured 8.1 x 8.9 by 7.5 cm. Pulsed Doppler evaluation of both ovaries demonstrates normal low-resistance arterial and venous waveforms. Other findings No abnormal free fluid. IMPRESSION: 1. Negative for ovarian torsion 2. Bilateral hydrosalpinx with slightly thickened wall and scattered internal echoes which could be secondary to acute inflammation/infection/acute PID. 3. Large 10.6 cm complex cyst posterior to the cervix, associated with left adnexa. Cyst appears more thick walled with less homogeneous internal echoes and is slightly increased in size. Mass remains indeterminate, but could represent tubo-ovarian abscess given inflammatory changes at the adnexa. 4. Large uterine fibroids Electronically Signed   By: Donavan Foil M.D.   On: 01/11/2019 20:10   US Pelvis Complete  Result Date: 01/11/2019 CLINICAL DATA:  Abdominal pain with abnormal CT EXAM: TRANSABDOMINAL AND TRANSVAGINAL ULTRASOUND OF PELVIS DOPPLER ULTRASOUND OF OVARIES TECHNIQUE: Both transabdominal and transvaginal ultrasound examinations of the pelvis were performed. Transabdominal technique was performed for global imaging of the pelvis including uterus, ovaries, adnexal regions, and pelvic cul-de-sac. It was necessary to proceed with endovaginal exam following the transabdominal exam to visualize the adnexa. Color and duplex Doppler ultrasound was utilized to evaluate blood flow to the ovaries. COMPARISON:  CT 01/11/2019, ultrasound 03/26/2018, CT 03/26/2018 FINDINGS: Uterus Measurements: 11.2 x 5.8 x 6.1 cm  = volume: 204.5 mL. Multiple large uterine fibroids. Exophytic fundal fibroid measures 6.8 x 6 x 6.5 cm. Pedunculated fibroid off the lower anterior uterine wall measures 6.2 x 4.5 x 5.7 cm. Endometrium Thickness: 6.3 mm.  No focal abnormality visualized. Right ovary Measurements: 2.7 x 1.8 x 2.2 cm = volume: 5.5 mL. Tortuous tubular structure in the right adnexa, felt consistent with hydrosalpinx. Slightly thickened  wall and minimal scattered internal echoes. Left ovary Measurements: 5.4 x 2.5 x 3.6 cm = volume: 25.4 mL. Dilated tubular structure in the left adnexa with slightly thickened wall, felt consistent with hydrosalpinx. Few internal scattered echoes. Posterior to the cervix is a large complex cyst measuring 10.6 x 8.1 x 8.7 cm with flow at the periphery. Cyst contains internal scattered echoes and slightly echogenic debris. Previously this was more homogeneous and measured 8.1 x 8.9 by 7.5 cm. Pulsed Doppler evaluation of both ovaries demonstrates normal low-resistance arterial and venous waveforms. Other findings No abnormal free fluid. IMPRESSION: 1. Negative for ovarian torsion 2. Bilateral hydrosalpinx with slightly thickened wall and scattered internal echoes which could be secondary to acute inflammation/infection/acute PID. 3. Large 10.6 cm complex cyst posterior to the cervix, associated with left adnexa. Cyst appears more thick walled with less homogeneous internal echoes and is slightly increased in size. Mass remains indeterminate, but could represent tubo-ovarian abscess given inflammatory changes at the adnexa. 4. Large uterine fibroids Electronically Signed   By: Donavan Foil M.D.   On: 01/11/2019 20:10   Ct Abdomen Pelvis W Contrast  Result Date: 01/11/2019 CLINICAL DATA:  Acute onset abdominal pain beginning last night. Suspected appendicitis. EXAM: CT ABDOMEN AND PELVIS WITH CONTRAST TECHNIQUE: Multidetector CT imaging of the abdomen and pelvis was performed using the standard  protocol following bolus administration of intravenous contrast. CONTRAST:  165mL OMNIPAQUE IOHEXOL 300 MG/ML SOLN, <See Chart> OMNIPAQUE IOHEXOL 300 MG/ML SOLN COMPARISON:  03/26/2018 FINDINGS: Lower Chest: No acute findings. Hepatobiliary: No hepatic masses identified. Gallbladder is unremarkable. No evidence of biliary ductal dilatation. Pancreas:  No mass or inflammatory changes. Spleen: Within normal limits in size and appearance. Adrenals/Urinary Tract: No masses identified. No evidence of hydronephrosis. Stomach/Bowel: No evidence of obstruction, inflammatory process or abnormal fluid collections. Normal appendix visualized. Vascular/Lymphatic: No pathologically enlarged lymph nodes. No abdominal aortic aneurysm. Reproductive: Several uterine fibroids are again seen. Largest in the anterior lower uterine segment measures 9 cm in maximum diameter. Another fibroid in the fundus measures 6 cm and shows diffuse hypervascular enhancement. Right-sided hydrosalpinx shows mild increase in size, with increased mural enhancement and mild adjacent inflammatory changes, consistent with tubo-ovarian abscess. Left hydrosalpinx shows no significant change. A complex cystic lesion which contains several enhancing internal septations is again seen in the pelvic cul-de-sac, currently measuring 9.5 x 7.7 cm compared to 9.2 x 7.9 cm previously. This likely represents a chronic left tubo-ovarian abscess, with cystic neoplasm of the left ovary considered less likely. No evidence of free fluid. Other:  None. Musculoskeletal:  No suspicious bone lesions identified. IMPRESSION: Mild increase in size of right hydrosalpinx with mild adjacent inflammatory changes, consistent with acute pelvic inflammatory disease. Stable left hydrosalpinx and 9.5 cm complex cystic lesion in pelvic cul-de-sac, likely representing chronic left tubo-ovarian abscess, with cystic neoplasm of left ovary considered less likely. Multiple uterine fibroids. No  evidence of appendicitis. Electronically Signed   By: Marlaine Hind M.D.   On: 01/11/2019 17:22   [place] Art/ven Flow Abd Pelv Doppler  Result Date: 01/11/2019 CLINICAL DATA:  Abdominal pain with abnormal CT EXAM: TRANSABDOMINAL AND TRANSVAGINAL ULTRASOUND OF PELVIS DOPPLER ULTRASOUND OF OVARIES TECHNIQUE: Both transabdominal and transvaginal ultrasound examinations of the pelvis were performed. Transabdominal technique was performed for global imaging of the pelvis including uterus, ovaries, adnexal regions, and pelvic cul-de-sac. It was necessary to proceed with endovaginal exam following the transabdominal exam to visualize the adnexa. Color and duplex Doppler ultrasound was utilized to  evaluate blood flow to the ovaries. COMPARISON:  CT 01/11/2019, ultrasound 03/26/2018, CT 03/26/2018 FINDINGS: Uterus Measurements: 11.2 x 5.8 x 6.1 cm = volume: 204.5 mL. Multiple large uterine fibroids. Exophytic fundal fibroid measures 6.8 x 6 x 6.5 cm. Pedunculated fibroid off the lower anterior uterine wall measures 6.2 x 4.5 x 5.7 cm. Endometrium Thickness: 6.3 mm.  No focal abnormality visualized. Right ovary Measurements: 2.7 x 1.8 x 2.2 cm = volume: 5.5 mL. Tortuous tubular structure in the right adnexa, felt consistent with hydrosalpinx. Slightly thickened wall and minimal scattered internal echoes. Left ovary Measurements: 5.4 x 2.5 x 3.6 cm = volume: 25.4 mL. Dilated tubular structure in the left adnexa with slightly thickened wall, felt consistent with hydrosalpinx. Few internal scattered echoes. Posterior to the cervix is a large complex cyst measuring 10.6 x 8.1 x 8.7 cm with flow at the periphery. Cyst contains internal scattered echoes and slightly echogenic debris. Previously this was more homogeneous and measured 8.1 x 8.9 by 7.5 cm. Pulsed Doppler evaluation of both ovaries demonstrates normal low-resistance arterial and venous waveforms. Other findings No abnormal free fluid. IMPRESSION: 1. Negative for  ovarian torsion 2. Bilateral hydrosalpinx with slightly thickened wall and scattered internal echoes which could be secondary to acute inflammation/infection/acute PID. 3. Large 10.6 cm complex cyst posterior to the cervix, associated with left adnexa. Cyst appears more thick walled with less homogeneous internal echoes and is slightly increased in size. Mass remains indeterminate, but could represent tubo-ovarian abscess given inflammatory changes at the adnexa. 4. Large uterine fibroids Electronically Signed   By: Donavan Foil M.D.   On: 01/11/2019 20:10   Dg Chest Port 1 View  Result Date: 01/11/2019 CLINICAL DATA:  Tachycardia with cough and fevers EXAM: PORTABLE CHEST 1 VIEW COMPARISON:  None. FINDINGS: Lungs are clear. Heart size and pulmonary vascularity are normal. No adenopathy. No pneumothorax. No bone lesions. IMPRESSION: No edema or consolidation. Electronically Signed   By: Lowella Grip III M.D.   On: 01/11/2019 13:41    Procedures .Critical Care Performed by: Lorin Glass, PA-C Authorized by: Lorin Glass, PA-C   Critical care provider statement:    Critical care time (minutes):  45   Critical care time was exclusive of:  Separately billable procedures and treating other patients and teaching time   Critical care was necessary to treat or prevent imminent or life-threatening deterioration of the following conditions:  Sepsis   Critical care was time spent personally by me on the following activities:  Discussions with consultants, evaluation of patient's response to treatment, examination of patient, ordering and performing treatments and interventions, ordering and review of laboratory studies, ordering and review of radiographic studies, pulse oximetry, re-evaluation of patient's condition, obtaining history from patient or surrogate and review of old charts   (including critical care time)  Medications Ordered in ED Medications  iohexol (OMNIPAQUE) 300  MG/ML solution 100 mL ( Intravenous Canceled Entry 01/11/19 1656)  ondansetron (ZOFRAN) injection 4 mg (4 mg Intravenous Given 01/11/19 2059)  morphine 4 MG/ML injection 4 mg (4 mg Intravenous Given 01/11/19 1315)  ondansetron (ZOFRAN) injection 4 mg (4 mg Intravenous Given 01/11/19 1315)  sodium chloride 0.9 % bolus 500 mL (0 mLs Intravenous Stopped 01/11/19 1428)  benzonatate (TESSALON) capsule 100 mg (100 mg Oral Given 01/11/19 1527)  morphine 4 MG/ML injection 4 mg (4 mg Intravenous Given 01/11/19 1528)  iohexol (OMNIPAQUE) 300 MG/ML solution 100 mL (100 mLs Intravenous Contrast Given 01/11/19 1656)  sodium chloride 0.9 %  bolus 1,000 mL (0 mLs Intravenous Stopped 01/11/19 2059)  HYDROmorphone (DILAUDID) injection 0.5 mg (0.5 mg Intravenous Given 01/11/19 2059)  cefOXitin (MEFOXIN) 2 g in sodium chloride 0.9 % 100 mL IVPB (0 g Intravenous Stopped 01/11/19 2040)  doxycycline (VIBRA-TABS) tablet 100 mg (100 mg Oral Given 01/11/19 1857)     Initial Impression / Assessment and Plan / ED Course  I have reviewed the triage vital signs and the nursing notes.  Pertinent labs & imaging results that were available during my care of the patient were reviewed by me and considered in my medical decision making (see chart for details).  Clinical Course as of Jan 11 2316  Mon Jan 11, 2019  [id number] Dr. Vivien Rota OB/GYN recommends p.o. doxycycline 100 mg twice daily and 2 g of cefoxitin every 6 hours for PID/presumed TOA in addition to obtaining ultrasound.   [EH]    Clinical Course User Index [EH] Lorin Glass, PA-C      Patient presents today for evaluation of 2 complaints. 1.  Cough chest x-ray obtained without evidence of acute abnormalities.  Her cough started as postnasal drip.  Given her general clinical picture concern for coronavirus.  Her coronavirus rapid test was negative, however based on the amount of her respiratory symptoms I am still concerned that she may have coronavirus.   She will be admitted as a person under investigation.  2 right lower quadrant abdominal pain: On arrival here she was tachycardic.  CBC was obtained with a significant leukocytosis of 23.9 with neutrophils of 18.7.  CMP does not have any significant hematologic or electrolyte derangement.  Lactic acid is not elevated.  Patient was unable to tolerate speculum exam due to pain.  Gonorrhea and Chlamydia testing was sent.  CT scan was obtained showing concern for a right-sided hydrosalpinx/TOA and a possible left-sided chronic TOA in addition to multiple fibroids.  With a possible source of infection, her initial tachycardia, and leukocytosis she was started on broad-spectrum antibiotics.  She was not given a 30/kg sepsis bolus as her lactic was not elevated over 4 and she was not hypotensive.  Her pain was treated in the emergency room with multiple doses of IV narcotics.  I spoke with Dr. Rosana Hoes of OB/GYN.  She is started on antibiotics in addition to a pelvic ultrasound being ordered. I spoke with medicine Dr. Roel Cluck who agreed to admit the patient.  RVP sent.    Patient remained hemodynamically stable while in my care.   Final Clinical Impressions(s) / ED Diagnoses   Final diagnoses:  TOA (tubo-ovarian abscess)  Sepsis without acute organ dysfunction, due to unspecified organism [facility])  Suspected COVID-19 virus infection    ED Discharge Orders    None       Ollen Gross 01/11/19 2320    Virgel Manifold, MD 01/16/19 1112

## 2019-01-11 NOTE — ED Notes (Signed)
Patient transported to CT 

## 2019-01-11 NOTE — ED Notes (Signed)
GC chlamydia sent to lab with printed req from previous order, lab stated they do not see previous collection.

## 2019-01-11 NOTE — ED Notes (Signed)
Patient transported to Ultrasound 

## 2019-01-11 NOTE — Consult Note (Signed)
OB/GYN Consult Note  Referring Provider: Wyn Quaker, PA  Joanna Smith is a 33 y.o. presenting for two days of worsening abdominal pain. Started last night and she laid down because she thought it would improve, felt a little better than had severe, stabbing pain when she got up this am. Decided to come to ED because of the severe pain. Poor appetite lately but no nausea/vomiting. No changes in stool habits. No known STI exposure. Also with post nasal drip that turned into a cough a few days ago that has not improved. Denies fever/chills or other complaints.  Reports she has had PID in the past but was treated as an outpatient for it. Has had cystic pelvic mass for several years.       Past Medical History:  Diagnosis Date  . IBS (irritable bowel syndrome)     Past Surgical History:  Procedure Laterality Date  . HERNIA REPAIR      OB History  No obstetric history on file.    Social History   Socioeconomic History  . Marital status: Significant Other    Spouse name: Not on file  . Number of children: Not on file  . Years of education: Not on file  . Highest education level: Not on file  Occupational History  . Not on file  Social Needs  . Financial resource strain: Not on file  . Food insecurity    Worry: Not on file    Inability: Not on file  . Transportation needs    Medical: Not on file    Non-medical: Not on file  Tobacco Use  . Smoking status: Current Some Day Smoker  . Smokeless tobacco: Never Used  Substance and Sexual Activity  . Alcohol use: Never    Frequency: Never  . Drug use: Not Currently    Types: Marijuana  . Sexual activity: Yes    Birth control/protection: None  Lifestyle  . Physical activity    Days per week: Not on file    Minutes per session: Not on file  . Stress: Not on file  Relationships  . Social Herbalist on phone: Not on file    Gets together: Not on file    Attends religious service: Not on file    Active  member of club or organization: Not on file    Attends meetings of clubs or organizations: Not on file    Relationship status: Not on file  Other Topics Concern  . Not on file  Social History Narrative   ** Merged History Encounter **        No family history on file.  (Not in a hospital admission)   Allergies  Allergen Reactions  . Macadamia Nut Oil Anaphylaxis    Review of Systems: Negative except for what is mentioned in HPI.     Physical Exam: BP 117/66   Pulse 93   Temp 97.8 F (36.6 C) (Oral)   Resp 14   Ht 5\' 2"  (1.575 m)   Wt 72.6 kg   LMP 01/04/2019 (Approximate)   SpO2 100%   BMI 29.26 kg/m  CONSTITUTIONAL: Well-developed, well-nourished female in no acute distress.  HENT:  Normocephalic, atraumatic, External right and left ear normal. Oropharynx is clear and moist EYES: Conjunctivae and EOM are normal. Pupils are equal, round, and reactive to light. No scleral icterus.  NECK: Normal range of motion, supple, no masses SKIN: Skin is warm and dry. No rash noted. Not diaphoretic. No erythema.  No pallor. [place]: Alert and oriented to person, place, and time. Normal reflexes, muscle tone coordination. No cranial nerve deficit noted. PSYCHIATRIC: Normal mood and affect. Normal behavior. Normal judgment and thought content. CARDIOVASCULAR: Normal heart rate noted RESPIRATORY: Effort normal, no problems with respiration noted ABDOMEN: Soft, mildly tender upper quadrants, significantly tender in lower quadrants, particularly the right lower quadrant PELVIC: normal appearing external female genitalia, normal appearing vaginal mucosa, only anterior portion of cervix visualized due to patient discomfort with exam, +CMT, bilateral adnexal tenderness, firm mass palpable posterior to uterus MUSCULOSKELETAL: Normal range of motion. No edema and no tenderness. 2+ distal pulses.  Pelvic exam done with RN chaperone present.  Pertinent Labs/Studies:   Results for orders  placed or performed during the hospital encounter of 01/11/19 (from the past 72 hour(s))  Comprehensive metabolic panel     Status: Abnormal   Collection Time: 01/11/19  1:24 PM  Result Value Ref Range   Sodium 136 135 - 145 mmol/L   Potassium 3.8 3.5 - 5.1 mmol/L   Chloride 104 98 - 111 mmol/L   CO2 22 22 - 32 mmol/L   Glucose, Bld 101 (H) 70 - 99 mg/dL   BUN 7 6 - 20 mg/dL   Creatinine, Ser 0.73 0.44 - 1.00 mg/dL   Calcium 8.6 (L) 8.9 - 10.3 mg/dL   Total Protein 7.1 6.5 - 8.1 g/dL   Albumin 3.5 3.5 - 5.0 g/dL   AST 13 (L) 15 - 41 U/L   ALT 11 0 - 44 U/L   Alkaline Phosphatase 72 38 - 126 U/L   Total Bilirubin 0.7 0.3 - 1.2 mg/dL   GFR calc non Af Amer >60 >60 mL/min   GFR calc Af Amer >60 >60 mL/min   Anion gap 10 5 - 15    Comment: Performed at [facility], [address], [place], [place] [address]  Lipase, blood     Status: None   Collection Time: 01/11/19  1:24 PM  Result Value Ref Range   Lipase 24 11 - 51 U/L    Comment: Performed at [facility] [address], [place], [place] [address]  CBC with Differential     Status: Abnormal   Collection Time: 01/11/19  1:24 PM  Result Value Ref Range   WBC 23.9 (H) 4.0 - 10.5 K/uL   RBC 4.88 3.87 - 5.11 MIL/uL   Hemoglobin 13.5 12.0 - 15.0 g/dL   HCT 42.0 36.0 - 46.0 %   MCV 86.1 80.0 - 100.0 fL   MCH 27.7 26.0 - 34.0 pg   MCHC 32.1 30.0 - 36.0 g/dL   RDW 14.3 11.5 - 15.5 %   Platelets 261 150 - 400 K/uL   nRBC 0.0 0.0 - 0.2 %   Neutrophils Relative % 78 %   Neutro Abs 18.7 (H) 1.7 - 7.7 K/uL   Lymphocytes Relative 11 %   Lymphs Abs 2.7 0.7 - 4.0 K/uL   Monocytes Relative 10 %   Monocytes Absolute 2.3 (H) 0.1 - 1.0 K/uL   Eosinophils Relative 0 %   Eosinophils Absolute 0.0 0.0 - 0.5 K/uL   Basophils Relative 0 %   Basophils Absolute 0.1 0.0 - 0.1 K/uL   Immature Granulocytes 1 %   Abs Immature Granulocytes 0.16 (H) 0.00 - 0.07 K/uL    Comment: Performed at [facility] [address],  [place], [place] [address]  Lactic acid, plasma     Status: None  Collection Time: 01/11/19  1:24 PM  Result Value Ref Range   Lactic Acid, Venous 0.7 0.5 - 1.9 mmol/L    Comment: Performed at [facility], [place] [address], [place], [place] [address]  SARS CORONAVIRUS 2 (TAT 6-24 HRS) Nasopharyngeal Nasopharyngeal Swab     Status: None   Collection Time: 01/11/19  1:24 PM   Specimen: Nasopharyngeal Swab  Result Value Ref Range   SARS Coronavirus 2 NEGATIVE NEGATIVE    Comment: (NOTE) SARS-CoV-2 target nucleic acids are NOT DETECTED. The SARS-CoV-2 RNA is generally detectable in upper and lower respiratory specimens during the acute phase of infection. Negative results do not preclude SARS-CoV-2 infection, do not rule out co-infections with other pathogens, and should not be used as the sole basis for treatment or other patient management decisions. Negative results must be combined with clinical observations, patient history, and epidemiological information. The expected result is Negative. Fact Sheet for Patients: SugarRoll.be Fact Sheet for Healthcare Providers: https://www.woods-mathews.com/ This test is not yet approved or cleared by the [place] FDA and  has been authorized for detection and/or diagnosis of SARS-CoV-2 by FDA under an Emergency Use Authorization (EUA). This EUA will remain  in effect (meaning this test can be used) for the duration of the COVID-19 declaration under Section 56 4(b)(1) of the Act, 21 U.S.C. section 360bbb-3(b)(1), unless the authorization is terminated or revoked sooner. Performed at [facility], [place] [address] [place], [place] [address]   Urinalysis, Routine w reflex microscopic     Status: Abnormal   Collection Time: 01/11/19  2:27 PM  Result Value Ref Range   Color, Urine YELLOW YELLOW   APPearance HAZY (A) CLEAR   Specific Gravity, Urine 1.008 1.005 - 1.030   pH 7.0 5.0 - 8.0    Glucose, UA NEGATIVE NEGATIVE mg/dL   Hgb urine dipstick NEGATIVE NEGATIVE   Bilirubin Urine NEGATIVE NEGATIVE   Ketones, ur NEGATIVE NEGATIVE mg/dL   Protein, ur NEGATIVE NEGATIVE mg/dL   Nitrite NEGATIVE NEGATIVE   Leukocytes,Ua TRACE (A) NEGATIVE   RBC / HPF 0-5 0 - 5 RBC/hpf   WBC, UA 6-10 0 - 5 WBC/hpf   Bacteria, UA NONE SEEN NONE SEEN   Squamous Epithelial / LPF 6-10 0 - 5   Mucus PRESENT     Comment: Performed at [facility], [place] [address] [place], [place] [address]  Pregnancy, urine     Status: None   Collection Time: 01/11/19  2:27 PM  Result Value Ref Range   Preg Test, Ur NEGATIVE NEGATIVE    Comment:        THE SENSITIVITY OF THIS METHODOLOGY IS >20 mIU/mL. Performed at [facility], [place] [address], [place], [place] [address]   Wet prep, genital     Status: Abnormal   Collection Time: 01/11/19  3:18 PM   Specimen: PATH Cytology Cervicovaginal Ancillary Only  Result Value Ref Range   Yeast Wet Prep HPF POC NONE SEEN NONE SEEN   Trich, Wet Prep NONE SEEN NONE SEEN   Clue Cells Wet Prep HPF POC NONE SEEN NONE SEEN   WBC, Wet Prep HPF POC FEW (A) NONE SEEN    Comment: Specimen diluted due to transport tube containing more than 1 ml of saline, interpret results with caution.   Sperm NONE SEEN     Comment: Performed at [facility], [place] [address] [place], [place] [address]   IMPRESSION: 1. Negative for ovarian torsion  2. Bilateral hydrosalpinx with slightly thickened wall and scattered internal echoes which could be secondary to acute inflammation/infection/acute PID. 3. Large 10.6 cm complex cyst posterior to the cervix, associated with left adnexa. Cyst appears more thick walled with less homogeneous internal echoes and is slightly increased in size. Mass remains indeterminate, but could represent tubo-ovarian abscess given inflammatory changes at the adnexa. 4. Large uterine fibroids     Assessment and Plan :Joanna Smith is a 33 y.o.  admitted for presumed PID. Requested admission to medicine due to patient's concurrent respiratory symptoms. Advised ED to start cefoxitin/doxcycline. TVUS with bilateral hydrosalpinx, consistent with PID. Large complex cyst posterior to uterus reviewed as possible TOA on [place], however, this has been present on other imaging, slightly bigger now. Feel this less likely represents a TOA due to patient's known history of cyst. Given this, no plans for surgical intervention at this point   Cont cefoxitin/doxy Regular diet per medicine Am CBC Repeat COVID swab per primary team   Thank you for this consult, we will follow along, please call or re-consult with further questions.   For OB/GYN questions, please call the [facility] [place] Monday - Friday, 8 am - 5 pm: [phone] All other times: [phone]    K. Arvilla Meres, M.D. Attending [place], [facility] for [employer], [place]

## 2019-01-11 NOTE — ED Triage Notes (Signed)
C/o abd. Pain onset last pm c/o congestion . Last BM yest. Denies urinary sx. Denies vomiting.

## 2019-01-12 ENCOUNTER — Other Ambulatory Visit: Payer: Self-pay

## 2019-01-12 LAB — COMPREHENSIVE METABOLIC PANEL
ALT: 6 U/L (ref 0–44)
AST: 13 U/L — ABNORMAL LOW (ref 15–41)
Albumin: 3.2 g/dL — ABNORMAL LOW (ref 3.5–5.0)
Alkaline Phosphatase: 87 U/L (ref 38–126)
Anion gap: 13 (ref 5–15)
BUN: 5 mg/dL — ABNORMAL LOW (ref 6–20)
CO2: 22 mmol/L (ref 22–32)
Calcium: 8.2 mg/dL — ABNORMAL LOW (ref 8.9–10.3)
Chloride: 102 mmol/L (ref 98–111)
Creatinine, Ser: 0.92 mg/dL (ref 0.44–1.00)
GFR calc Af Amer: >60 mL/min (ref >60)
GFR calc non Af Amer: >60 mL/min (ref >60)
Glucose, Bld: 73 mg/dL (ref 70–99)
Potassium: 3.7 mmol/L (ref 3.5–5.1)
Sodium: 137 mmol/L (ref 135–145)
Total Bilirubin: 0.7 mg/dL (ref 0.3–1.2)
Total Protein: 6.5 g/dL (ref 6.5–8.1)

## 2019-01-12 LAB — CBC
HCT: 39.4 % (ref 36.0–46.0)
Hemoglobin: 12.6 g/dL (ref 12.0–15.0)
MCH: 27.8 pg (ref 26.0–34.0)
MCHC: 32 g/dL (ref 30.0–36.0)
MCV: 87 fL (ref 80.0–100.0)
Platelets: 268 10*3/uL (ref 150–400)
RBC: 4.53 MIL/uL (ref 3.87–5.11)
RDW: 14.4 % (ref 11.5–15.5)
WBC: 22.3 10*3/uL — ABNORMAL HIGH (ref 4.0–10.5)
nRBC: 0 % (ref 0.0–0.2)

## 2019-01-12 LAB — MAGNESIUM: Magnesium: 1.5 mg/dL — ABNORMAL LOW (ref 1.7–2.4)

## 2019-01-12 LAB — TSH: TSH: 1.007 u[IU]/mL (ref 0.350–4.500)

## 2019-01-12 LAB — PHOSPHORUS: Phosphorus: 3.5 mg/dL (ref 2.5–4.6)

## 2019-01-12 LAB — GC/CHLAMYDIA PROBE AMP (~~LOC~~) NOT AT ARMC
Chlamydia: NEGATIVE
Neisseria Gonorrhea: NEGATIVE

## 2019-01-12 MED ORDER — PSEUDOEPHEDRINE HCL ER 120 MG PO TB12
120.0000 mg | ORAL_TABLET | Freq: Two times a day (BID) | ORAL | Status: DC
  Administered 2019-01-12 – 2019-01-15 (×6): 120 mg via ORAL
  Filled 2019-01-12 (×7): qty 1

## 2019-01-12 MED ORDER — HYDROCODONE-ACETAMINOPHEN 5-325 MG PO TABS
1.0000 | ORAL_TABLET | ORAL | Status: DC | PRN
  Administered 2019-01-12 (×2): 1 via ORAL
  Administered 2019-01-12: 2 via ORAL
  Administered 2019-01-13 (×2): 1 via ORAL
  Filled 2019-01-12 (×3): qty 1
  Filled 2019-01-12: qty 2
  Filled 2019-01-12 (×4): qty 1

## 2019-01-12 MED ORDER — ONDANSETRON HCL 4 MG PO TABS: 4.0000 mg | ORAL_TABLET | Freq: Four times a day (QID) | ORAL | Status: DC | PRN

## 2019-01-12 MED ORDER — DIPHENHYDRAMINE HCL 25 MG PO CAPS: 25.0000 mg | ORAL_CAPSULE | Freq: Every evening | ORAL | Status: DC | PRN

## 2019-01-12 MED ORDER — ACETAMINOPHEN 325 MG PO TABS
650.0000 mg | ORAL_TABLET | Freq: Four times a day (QID) | ORAL | Status: DC | PRN
  Administered 2019-01-14: 650 mg via ORAL
  Filled 2019-01-12: qty 2

## 2019-01-12 MED ORDER — ACETAMINOPHEN 650 MG RE SUPP: 650.0000 mg | Freq: Four times a day (QID) | RECTAL | Status: DC | PRN

## 2019-01-12 MED ORDER — DOXYCYCLINE HYCLATE 100 MG PO TABS
100.0000 mg | ORAL_TABLET | Freq: Two times a day (BID) | ORAL | Status: DC
  Administered 2019-01-12 – 2019-01-15 (×7): 100 mg via ORAL
  Filled 2019-01-12 (×7): qty 1

## 2019-01-12 MED ORDER — SODIUM CHLORIDE 0.9 % IV SOLN
2.0000 g | Freq: Four times a day (QID) | INTRAVENOUS | Status: DC
  Administered 2019-01-12 – 2019-01-15 (×14): 2 g via INTRAVENOUS
  Filled 2019-01-12 (×20): qty 2

## 2019-01-12 MED ORDER — INFLUENZA VAC SPLIT QUAD 0.5 ML IM SUSY: 0.5000 mL | PREFILLED_SYRINGE | INTRAMUSCULAR | Status: DC | PRN

## 2019-01-12 MED ORDER — SODIUM CHLORIDE 0.9 % IV SOLN
INTRAVENOUS | Status: AC
  Administered 2019-01-12: 01:00:00 via INTRAVENOUS

## 2019-01-12 MED ORDER — ONDANSETRON HCL 4 MG/2ML IJ SOLN
4.0000 mg | Freq: Four times a day (QID) | INTRAMUSCULAR | Status: DC | PRN
  Administered 2019-01-13 – 2019-01-15 (×5): 4 mg via INTRAVENOUS
  Filled 2019-01-12 (×5): qty 2

## 2019-01-12 NOTE — ED Notes (Signed)
ED TO INPATIENT HANDOFF REPORT  ED Nurse Name and Phone #: [facility] [phone]  S Name/Age/Gender Joanna Smith 33 y.o. female Room/Bed: 010C/010C  Code Status   Code Status: Full Code  Home/SNF/Other Home Patient oriented to: self, place, time and situation Is this baseline? Yes   Triage Complete: Triage complete  Chief Complaint abd pain and congestion  Triage Note C/o abd. Pain onset last pm c/o congestion . Last BM yest. Denies urinary sx. Denies vomiting.    Allergies Allergies  Allergen Reactions  . Macadamia Nut Oil Anaphylaxis    Level of Care/Admitting Diagnosis ED Disposition    ED Disposition Condition Comment   Admit  Hospital Area: [facility] [[id number]]  Level of Care: Med-Surg [16]  Covid Evaluation: Confirmed COVID Negative  Diagnosis: PID (acute pelvic inflammatory disease) [id number]  Admitting Physician: Toy Baker [[id number]]  Attending Physician: Toy Baker [[id number]]  Estimated length of stay: 3 - 4 days  Certification:: I certify this patient will need inpatient services for at least 2 midnights  PT Class (Do Not Modify): Inpatient [101]  PT Acc Code (Do Not Modify): Private [1]       B Medical/Surgery History Past Medical History:  Diagnosis Date  . IBS (irritable bowel syndrome)    Past Surgical History:  Procedure Laterality Date  . HERNIA REPAIR       A IV Location/Drains/Wounds Patient Lines/Drains/Airways Status   Active Line/Drains/Airways    Name:   Placement date:   Placement time:   Site:   Days:   Peripheral IV 01/11/19 Right Antecubital   01/11/19    1320    Antecubital   1          Intake/Output Last 24 hours  Intake/Output Summary (Last 24 hours) at 01/12/2019 0844 Last data filed at 01/11/2019 1428 Gross per 24 hour  Intake 500 ml  Output -  Net 500 ml    Labs/Imaging Results for orders placed or performed during the hospital encounter of 01/11/19 (from the past 48 hour(s))   Comprehensive metabolic panel     Status: Abnormal   Collection Time: 01/11/19  1:24 PM  Result Value Ref Range   Sodium 136 135 - 145 mmol/L   Potassium 3.8 3.5 - 5.1 mmol/L   Chloride 104 98 - 111 mmol/L   CO2 22 22 - 32 mmol/L   Glucose, Bld 101 (H) 70 - 99 mg/dL   BUN 7 6 - 20 mg/dL   Creatinine, Ser 0.73 0.44 - 1.00 mg/dL   Calcium 8.6 (L) 8.9 - 10.3 mg/dL   Total Protein 7.1 6.5 - 8.1 g/dL   Albumin 3.5 3.5 - 5.0 g/dL   AST 13 (L) 15 - 41 U/L   ALT 11 0 - 44 U/L   Alkaline Phosphatase 72 38 - 126 U/L   Total Bilirubin 0.7 0.3 - 1.2 mg/dL   GFR calc non Af Amer >60 >60 mL/min   GFR calc Af Amer >60 >60 mL/min   Anion gap 10 5 - 15    Comment: Performed at [facility], [address], [place], [place] [address]  Lipase, blood     Status: None   Collection Time: 01/11/19  1:24 PM  Result Value Ref Range   Lipase 24 11 - 51 U/L    Comment: Performed at [facility] [address] [place], [place] [address]  CBC with Differential     Status: Abnormal   Collection Time: 01/11/19  1:24 PM  Result Value Ref Range   WBC 23.9 (H) 4.0 - 10.5 K/uL   RBC 4.88 3.87 - 5.11 MIL/uL   Hemoglobin 13.5 12.0 - 15.0 g/dL   HCT 42.0 36.0 - 46.0 %   MCV 86.1 80.0 - 100.0 fL   MCH 27.7 26.0 - 34.0 pg   MCHC 32.1 30.0 - 36.0 g/dL   RDW 14.3 11.5 - 15.5 %   Platelets 261 150 - 400 K/uL   nRBC 0.0 0.0 - 0.2 %   Neutrophils Relative % 78 %   Neutro Abs 18.7 (H) 1.7 - 7.7 K/uL   Lymphocytes Relative 11 %   Lymphs Abs 2.7 0.7 - 4.0 K/uL   Monocytes Relative 10 %   Monocytes Absolute 2.3 (H) 0.1 - 1.0 K/uL   Eosinophils Relative 0 %   Eosinophils Absolute 0.0 0.0 - 0.5 K/uL   Basophils Relative 0 %   Basophils Absolute 0.1 0.0 - 0.1 K/uL   Immature Granulocytes 1 %   Abs Immature Granulocytes 0.16 (H) 0.00 - 0.07 K/uL    Comment: Performed at [facility], [address], [place], [place] [address]  Lactic acid, plasma     Status: None   Collection Time: 01/11/19   1:24 PM  Result Value Ref Range   Lactic Acid, Venous 0.7 0.5 - 1.9 mmol/L    Comment: Performed at [facility] [address] [place], [place] [address]  SARS CORONAVIRUS 2 (TAT 6-24 HRS) Nasopharyngeal Nasopharyngeal Swab     Status: None   Collection Time: 01/11/19  1:24 PM   Specimen: Nasopharyngeal Swab  Result Value Ref Range   SARS Coronavirus 2 NEGATIVE NEGATIVE    Comment: (NOTE) SARS-CoV-2 target nucleic acids are NOT DETECTED. The SARS-CoV-2 RNA is generally detectable in upper and lower respiratory specimens during the acute phase of infection. Negative results do not preclude SARS-CoV-2 infection, do not rule out co-infections with other pathogens, and should not be used as the sole basis for treatment or other patient management decisions. Negative results must be combined with clinical observations, patient history, and epidemiological information. The expected result is Negative. Fact Sheet for Patients: SugarRoll.be Fact Sheet for Healthcare Providers: https://www.woods-mathews.com/ This test is not yet approved or cleared by the [place] FDA and  has been authorized for detection and/or diagnosis of SARS-CoV-2 by FDA under an Emergency Use Authorization (EUA). This EUA will remain  in effect (meaning this test can be used) for the duration of the COVID-19 declaration under Section 56 4(b)(1) of the Act, 21 U.S.C. section 360bbb-3(b)(1), unless the authorization is terminated or revoked sooner. Performed at [facility], [place] [address] [place], [place] [address]   Urinalysis, Routine w reflex microscopic     Status: Abnormal   Collection Time: 01/11/19  2:27 PM  Result Value Ref Range   Color, Urine YELLOW YELLOW   APPearance HAZY (A) CLEAR   Specific Gravity, Urine 1.008 1.005 - 1.030   pH 7.0 5.0 - 8.0   Glucose, UA NEGATIVE NEGATIVE mg/dL   Hgb urine dipstick NEGATIVE NEGATIVE   Bilirubin Urine  NEGATIVE NEGATIVE   Ketones, ur NEGATIVE NEGATIVE mg/dL   Protein, ur NEGATIVE NEGATIVE mg/dL   Nitrite NEGATIVE NEGATIVE   Leukocytes,Ua TRACE (A) NEGATIVE   RBC / HPF 0-5 0 - 5 RBC/hpf   WBC, UA 6-10 0 - 5 WBC/hpf   Bacteria, UA NONE SEEN NONE SEEN   Squamous Epithelial / LPF 6-10  0 - 5   Mucus PRESENT     Comment: Performed at [facility], [place] [address] [place] [address], [place], [place] [address]  Pregnancy, urine     Status: None   Collection Time: 01/11/19  2:27 PM  Result Value Ref Range   Preg Test, Ur NEGATIVE NEGATIVE    Comment:        THE SENSITIVITY OF THIS METHODOLOGY IS >20 mIU/mL. Performed at [facility], [place] [address], [place], [place] [address]   Wet prep, genital     Status: Abnormal   Collection Time: 01/11/19  3:18 PM   Specimen: PATH Cytology Cervicovaginal Ancillary Only  Result Value Ref Range   Yeast Wet Prep HPF POC NONE SEEN NONE SEEN   Trich, Wet Prep NONE SEEN NONE SEEN   Clue Cells Wet Prep HPF POC NONE SEEN NONE SEEN   WBC, Wet Prep HPF POC FEW (A) NONE SEEN    Comment: Specimen diluted due to transport tube containing more than 1 ml of saline, interpret results with caution.   Sperm NONE SEEN     Comment: Performed at [facility], [place] [address] [place], [place] [address]  Respiratory Panel by PCR     Status: Abnormal   Collection Time: 01/11/19  7:02 PM   Specimen: Nasopharyngeal Swab; Respiratory  Result Value Ref Range   Adenovirus NOT DETECTED NOT DETECTED   Coronavirus 229E NOT DETECTED NOT DETECTED    Comment: (NOTE) The Coronavirus on the Respiratory Panel, DOES NOT test for the novel  Coronavirus (2019 nCoV)    Coronavirus HKU1 NOT DETECTED NOT DETECTED   Coronavirus NL63 NOT DETECTED NOT DETECTED   Coronavirus OC43 NOT DETECTED NOT DETECTED   Metapneumovirus NOT DETECTED NOT DETECTED   Rhinovirus / Enterovirus DETECTED (A) NOT DETECTED   Influenza A NOT DETECTED NOT DETECTED   Influenza B NOT DETECTED NOT DETECTED    Parainfluenza Virus 1 NOT DETECTED NOT DETECTED   Parainfluenza Virus 2 NOT DETECTED NOT DETECTED   Parainfluenza Virus 3 NOT DETECTED NOT DETECTED   Parainfluenza Virus 4 NOT DETECTED NOT DETECTED   Respiratory Syncytial Virus NOT DETECTED NOT DETECTED   Bordetella pertussis NOT DETECTED NOT DETECTED   Chlamydophila pneumoniae NOT DETECTED NOT DETECTED   Mycoplasma pneumoniae NOT DETECTED NOT DETECTED    Comment: Performed at [facility], [place] [address] [place], [place] [address]  Influenza panel by PCR (type A & B)     Status: None   Collection Time: 01/11/19  7:02 PM  Result Value Ref Range   Influenza A By PCR NEGATIVE NEGATIVE   Influenza B By PCR NEGATIVE NEGATIVE    Comment: (NOTE) The Xpert Xpress Flu assay is intended as an aid in the diagnosis of  influenza and should not be used as a sole basis for treatment.  This  assay is FDA approved for nasopharyngeal swab specimens only. Nasal  washings and aspirates are unacceptable for Xpert Xpress Flu testing. Performed at [facility], [place] [address] [place], [place] [address]   Magnesium     Status: Abnormal   Collection Time: 01/12/19  2:31 AM  Result Value Ref Range   Magnesium 1.5 (L) 1.7 - 2.4 mg/dL    Comment: Performed at [facility] [address], [place], [place] [address]  Phosphorus     Status: None   Collection Time: 01/12/19  2:31 AM  Result Value Ref Range   Phosphorus  3.5 2.5 - 4.6 mg/dL    Comment: Performed at [facility], [place] [address] [place], [place] [address]  TSH     Status: None   Collection Time: 01/12/19  2:31 AM  Result Value Ref Range   TSH 1.007 0.350 - 4.500 uIU/mL    Comment: Performed by a 3rd Generation assay with a functional sensitivity of <=0.01 uIU/mL. Performed at [facility], [place] [address], [place], [place] [address]   Comprehensive metabolic panel     Status: Abnormal   Collection Time: 01/12/19  2:31 AM  Result Value Ref Range   Sodium 137  135 - 145 mmol/L   Potassium 3.7 3.5 - 5.1 mmol/L   Chloride 102 98 - 111 mmol/L   CO2 22 22 - 32 mmol/L   Glucose, Bld 73 70 - 99 mg/dL   BUN 5 (L) 6 - 20 mg/dL   Creatinine, Ser 0.92 0.44 - 1.00 mg/dL   Calcium 8.2 (L) 8.9 - 10.3 mg/dL   Total Protein 6.5 6.5 - 8.1 g/dL   Albumin 3.2 (L) 3.5 - 5.0 g/dL   AST 13 (L) 15 - 41 U/L   ALT 6 0 - 44 U/L   Alkaline Phosphatase 87 38 - 126 U/L   Total Bilirubin 0.7 0.3 - 1.2 mg/dL   GFR calc non Af Amer >60 >60 mL/min   GFR calc Af Amer >60 >60 mL/min   Anion gap 13 5 - 15    Comment: Performed at [facility] [address], [place], [place] [address]  CBC     Status: Abnormal   Collection Time: 01/12/19  2:31 AM  Result Value Ref Range   WBC 22.3 (H) 4.0 - 10.5 K/uL   RBC 4.53 3.87 - 5.11 MIL/uL   Hemoglobin 12.6 12.0 - 15.0 g/dL   HCT 39.4 36.0 - 46.0 %   MCV 87.0 80.0 - 100.0 fL   MCH 27.8 26.0 - 34.0 pg   MCHC 32.0 30.0 - 36.0 g/dL   RDW 14.4 11.5 - 15.5 %   Platelets 268 150 - 400 K/uL   nRBC 0.0 0.0 - 0.2 %    Comment: Performed at [facility], [place] [address] [place], [place] [address]   US Transvaginal Non-ob  Result Date: 01/11/2019 CLINICAL DATA:  Abdominal pain with abnormal CT EXAM: TRANSABDOMINAL AND TRANSVAGINAL ULTRASOUND OF PELVIS DOPPLER ULTRASOUND OF OVARIES TECHNIQUE: Both transabdominal and transvaginal ultrasound examinations of the pelvis were performed. Transabdominal technique was performed for global imaging of the pelvis including uterus, ovaries, adnexal regions, and pelvic cul-de-sac. It was necessary to proceed with endovaginal exam following the transabdominal exam to visualize the adnexa. Color and duplex Doppler ultrasound was utilized to evaluate blood flow to the ovaries. COMPARISON:  CT 01/11/2019, ultrasound 03/26/2018, CT 03/26/2018 FINDINGS: Uterus Measurements: 11.2 x 5.8 x 6.1 cm = volume: 204.5 mL. Multiple large uterine fibroids. Exophytic fundal fibroid measures 6.8 x 6 x 6.5 cm.  Pedunculated fibroid off the lower anterior uterine wall measures 6.2 x 4.5 x 5.7 cm. Endometrium Thickness: 6.3 mm.  No focal abnormality visualized. Right ovary Measurements: 2.7 x 1.8 x 2.2 cm = volume: 5.5 mL. Tortuous tubular structure in the right adnexa, felt consistent with hydrosalpinx. Slightly thickened wall and minimal scattered internal echoes. Left ovary Measurements: 5.4 x 2.5 x 3.6 cm = volume: 25.4 mL. Dilated tubular structure in the left adnexa with slightly thickened wall, felt consistent with hydrosalpinx. Few internal  scattered echoes. Posterior to the cervix is a large complex cyst measuring 10.6 x 8.1 x 8.7 cm with flow at the periphery. Cyst contains internal scattered echoes and slightly echogenic debris. Previously this was more homogeneous and measured 8.1 x 8.9 by 7.5 cm. Pulsed Doppler evaluation of both ovaries demonstrates normal low-resistance arterial and venous waveforms. Other findings No abnormal free fluid. IMPRESSION: 1. Negative for ovarian torsion 2. Bilateral hydrosalpinx with slightly thickened wall and scattered internal echoes which could be secondary to acute inflammation/infection/acute PID. 3. Large 10.6 cm complex cyst posterior to the cervix, associated with left adnexa. Cyst appears more thick walled with less homogeneous internal echoes and is slightly increased in size. Mass remains indeterminate, but could represent tubo-ovarian abscess given inflammatory changes at the adnexa. 4. Large uterine fibroids Electronically Signed   By: Donavan Foil M.D.   On: 01/11/2019 20:10   US Pelvis Complete  Result Date: 01/11/2019 CLINICAL DATA:  Abdominal pain with abnormal CT EXAM: TRANSABDOMINAL AND TRANSVAGINAL ULTRASOUND OF PELVIS DOPPLER ULTRASOUND OF OVARIES TECHNIQUE: Both transabdominal and transvaginal ultrasound examinations of the pelvis were performed. Transabdominal technique was performed for global imaging of the pelvis including uterus, ovaries, adnexal  regions, and pelvic cul-de-sac. It was necessary to proceed with endovaginal exam following the transabdominal exam to visualize the adnexa. Color and duplex Doppler ultrasound was utilized to evaluate blood flow to the ovaries. COMPARISON:  CT 01/11/2019, ultrasound 03/26/2018, CT 03/26/2018 FINDINGS: Uterus Measurements: 11.2 x 5.8 x 6.1 cm = volume: 204.5 mL. Multiple large uterine fibroids. Exophytic fundal fibroid measures 6.8 x 6 x 6.5 cm. Pedunculated fibroid off the lower anterior uterine wall measures 6.2 x 4.5 x 5.7 cm. Endometrium Thickness: 6.3 mm.  No focal abnormality visualized. Right ovary Measurements: 2.7 x 1.8 x 2.2 cm = volume: 5.5 mL. Tortuous tubular structure in the right adnexa, felt consistent with hydrosalpinx. Slightly thickened wall and minimal scattered internal echoes. Left ovary Measurements: 5.4 x 2.5 x 3.6 cm = volume: 25.4 mL. Dilated tubular structure in the left adnexa with slightly thickened wall, felt consistent with hydrosalpinx. Few internal scattered echoes. Posterior to the cervix is a large complex cyst measuring 10.6 x 8.1 x 8.7 cm with flow at the periphery. Cyst contains internal scattered echoes and slightly echogenic debris. Previously this was more homogeneous and measured 8.1 x 8.9 by 7.5 cm. Pulsed Doppler evaluation of both ovaries demonstrates normal low-resistance arterial and venous waveforms. Other findings No abnormal free fluid. IMPRESSION: 1. Negative for ovarian torsion 2. Bilateral hydrosalpinx with slightly thickened wall and scattered internal echoes which could be secondary to acute inflammation/infection/acute PID. 3. Large 10.6 cm complex cyst posterior to the cervix, associated with left adnexa. Cyst appears more thick walled with less homogeneous internal echoes and is slightly increased in size. Mass remains indeterminate, but could represent tubo-ovarian abscess given inflammatory changes at the adnexa. 4. Large uterine fibroids Electronically  Signed   By: Donavan Foil M.D.   On: 01/11/2019 20:10   Ct Abdomen Pelvis W Contrast  Result Date: 01/11/2019 CLINICAL DATA:  Acute onset abdominal pain beginning last night. Suspected appendicitis. EXAM: CT ABDOMEN AND PELVIS WITH CONTRAST TECHNIQUE: Multidetector CT imaging of the abdomen and pelvis was performed using the standard protocol following bolus administration of intravenous contrast. CONTRAST:  161mL OMNIPAQUE IOHEXOL 300 MG/ML SOLN, <See Chart> OMNIPAQUE IOHEXOL 300 MG/ML SOLN COMPARISON:  03/26/2018 FINDINGS: Lower Chest: No acute findings. Hepatobiliary: No hepatic masses identified. Gallbladder is unremarkable. No evidence of biliary  ductal dilatation. Pancreas:  No mass or inflammatory changes. Spleen: Within normal limits in size and appearance. Adrenals/Urinary Tract: No masses identified. No evidence of hydronephrosis. Stomach/Bowel: No evidence of obstruction, inflammatory process or abnormal fluid collections. Normal appendix visualized. Vascular/Lymphatic: No pathologically enlarged lymph nodes. No abdominal aortic aneurysm. Reproductive: Several uterine fibroids are again seen. Largest in the anterior lower uterine segment measures 9 cm in maximum diameter. Another fibroid in the fundus measures 6 cm and shows diffuse hypervascular enhancement. Right-sided hydrosalpinx shows mild increase in size, with increased mural enhancement and mild adjacent inflammatory changes, consistent with tubo-ovarian abscess. Left hydrosalpinx shows no significant change. A complex cystic lesion which contains several enhancing internal septations is again seen in the pelvic cul-de-sac, currently measuring 9.5 x 7.7 cm compared to 9.2 x 7.9 cm previously. This likely represents a chronic left tubo-ovarian abscess, with cystic neoplasm of the left ovary considered less likely. No evidence of free fluid. Other:  None. Musculoskeletal:  No suspicious bone lesions identified. IMPRESSION: Mild increase in  size of right hydrosalpinx with mild adjacent inflammatory changes, consistent with acute pelvic inflammatory disease. Stable left hydrosalpinx and 9.5 cm complex cystic lesion in pelvic cul-de-sac, likely representing chronic left tubo-ovarian abscess, with cystic neoplasm of left ovary considered less likely. Multiple uterine fibroids. No evidence of appendicitis. Electronically Signed   By: Marlaine Hind M.D.   On: 01/11/2019 17:22   [place] Art/ven Flow Abd Pelv Doppler  Result Date: 01/11/2019 CLINICAL DATA:  Abdominal pain with abnormal CT EXAM: TRANSABDOMINAL AND TRANSVAGINAL ULTRASOUND OF PELVIS DOPPLER ULTRASOUND OF OVARIES TECHNIQUE: Both transabdominal and transvaginal ultrasound examinations of the pelvis were performed. Transabdominal technique was performed for global imaging of the pelvis including uterus, ovaries, adnexal regions, and pelvic cul-de-sac. It was necessary to proceed with endovaginal exam following the transabdominal exam to visualize the adnexa. Color and duplex Doppler ultrasound was utilized to evaluate blood flow to the ovaries. COMPARISON:  CT 01/11/2019, ultrasound 03/26/2018, CT 03/26/2018 FINDINGS: Uterus Measurements: 11.2 x 5.8 x 6.1 cm = volume: 204.5 mL. Multiple large uterine fibroids. Exophytic fundal fibroid measures 6.8 x 6 x 6.5 cm. Pedunculated fibroid off the lower anterior uterine wall measures 6.2 x 4.5 x 5.7 cm. Endometrium Thickness: 6.3 mm.  No focal abnormality visualized. Right ovary Measurements: 2.7 x 1.8 x 2.2 cm = volume: 5.5 mL. Tortuous tubular structure in the right adnexa, felt consistent with hydrosalpinx. Slightly thickened wall and minimal scattered internal echoes. Left ovary Measurements: 5.4 x 2.5 x 3.6 cm = volume: 25.4 mL. Dilated tubular structure in the left adnexa with slightly thickened wall, felt consistent with hydrosalpinx. Few internal scattered echoes. Posterior to the cervix is a large complex cyst measuring 10.6 x 8.1 x 8.7 cm with  flow at the periphery. Cyst contains internal scattered echoes and slightly echogenic debris. Previously this was more homogeneous and measured 8.1 x 8.9 by 7.5 cm. Pulsed Doppler evaluation of both ovaries demonstrates normal low-resistance arterial and venous waveforms. Other findings No abnormal free fluid. IMPRESSION: 1. Negative for ovarian torsion 2. Bilateral hydrosalpinx with slightly thickened wall and scattered internal echoes which could be secondary to acute inflammation/infection/acute PID. 3. Large 10.6 cm complex cyst posterior to the cervix, associated with left adnexa. Cyst appears more thick walled with less homogeneous internal echoes and is slightly increased in size. Mass remains indeterminate, but could represent tubo-ovarian abscess given inflammatory changes at the adnexa. 4. Large uterine fibroids Electronically Signed   By: Madie Reno.D.  On: 01/11/2019 20:10   Dg Chest Port 1 View  Result Date: 01/11/2019 CLINICAL DATA:  Tachycardia with cough and fevers EXAM: PORTABLE CHEST 1 VIEW COMPARISON:  None. FINDINGS: Lungs are clear. Heart size and pulmonary vascularity are normal. No adenopathy. No pneumothorax. No bone lesions. IMPRESSION: No edema or consolidation. Electronically Signed   By: Lowella Grip III M.D.   On: 01/11/2019 13:41    Pending Labs Unresulted Labs (From admission, onward)    Start     Ordered   01/11/19 2049  Urine rapid drug screen (hosp performed)  Add-on,   AD     01/11/19 2048   01/11/19 1735  Culture, blood (routine x 2)  BLOOD CULTURE X 2,   STAT     01/11/19 1736          Vitals/Pain Today's Vitals   01/12/19 0600 01/12/19 0700 01/12/19 0727 01/12/19 0751  BP: 111/73 113/82  113/82  Pulse:  84  92  Resp:    20  Temp:    99.7 F (37.6 C)  TempSrc:    Oral  SpO2:  96%  96%  Weight:      Height:      PainSc:   5      Isolation Precautions Droplet precaution  Medications Medications  iohexol (OMNIPAQUE) 300 MG/ML  solution 100 mL ( Intravenous Canceled Entry 01/11/19 1656)  ondansetron (ZOFRAN) injection 4 mg (4 mg Intravenous Given 01/11/19 2059)  acetaminophen (TYLENOL) tablet 650 mg (has no administration in time range)    Or  acetaminophen (TYLENOL) suppository 650 mg (has no administration in time range)  HYDROcodone-acetaminophen (NORCO/VICODIN) 5-325 MG per tablet 1-2 tablet (1 tablet Oral Given 01/12/19 0841)  ondansetron (ZOFRAN) tablet 4 mg (has no administration in time range)    Or  ondansetron (ZOFRAN) injection 4 mg (has no administration in time range)  0.9 %  sodium chloride infusion ( Intravenous New Bag/Given 01/12/19 0101)  doxycycline (VIBRA-TABS) tablet 100 mg (100 mg Oral Given 01/12/19 0841)  cefOXitin (MEFOXIN) 2 g in sodium chloride 0.9 % 100 mL IVPB ( Intravenous Canceled Entry 01/12/19 0836)  morphine 4 MG/ML injection 4 mg (4 mg Intravenous Given 01/11/19 1315)  ondansetron (ZOFRAN) injection 4 mg (4 mg Intravenous Given 01/11/19 1315)  sodium chloride 0.9 % bolus 500 mL (0 mLs Intravenous Stopped 01/11/19 1428)  benzonatate (TESSALON) capsule 100 mg (100 mg Oral Given 01/11/19 1527)  morphine 4 MG/ML injection 4 mg (4 mg Intravenous Given 01/11/19 1528)  iohexol (OMNIPAQUE) 300 MG/ML solution 100 mL (100 mLs Intravenous Contrast Given 01/11/19 1656)  sodium chloride 0.9 % bolus 1,000 mL (0 mLs Intravenous Stopped 01/11/19 2059)  HYDROmorphone (DILAUDID) injection 0.5 mg (0.5 mg Intravenous Given 01/11/19 2059)  cefOXitin (MEFOXIN) 2 g in sodium chloride 0.9 % 100 mL IVPB (0 g Intravenous Stopped 01/11/19 2040)  doxycycline (VIBRA-TABS) tablet 100 mg (100 mg Oral Given 01/11/19 1857)    Mobility walks Low fall risk   Focused Assessments Pulmonary Assessment Handoff:  Lung sounds:   O2 Device: Room Air        R Recommendations: See Admitting Provider Note  Report given to:   Additional Notes: GI assesment

## 2019-01-12 NOTE — Progress Notes (Signed)
PROGRESS NOTE    Joanna Smith  [id number] DOB: 1985/11/05 DOA: 01/11/2019 PCP: Patient, No Pcp Per   Brief Narrative: 33 y.o. female with   medical history significant  Of PID, TOA    Presented with   abdominal pain congestion no nausea vomiting no diarrhea has had significant cough for past 4 days occasionally productive of sputum she had initially cold-like symptoms last week but now settled into her chest.  She has occasional chest pain with coughing.  No leg swelling no hemoptysis no personal history of PE or DVT. Patient denied any sick contacts she try to use Zyrtec but did not seem to help Having fevers last night and feels like her abdomen right lower quadrant pain feels similar to when she had fluid in her fallopian tubes in the past no vaginal discharge  Patient initially stated to emergency department that [facility] virus testing has already virus on them and that she felt "set up because she is a black woman" for being tested for coronavirus She eventually agreed to get tested   While in ER: CT abdomen showed evidence of PID as well as left hydrosalpinx and 9.5 cm complex cystic lesion of pelvic cul-de-sac likely secondary to chronic left tubo-ovarian abscess OB/GYN was consulted requesting medicine to admit given respiratory symptoms although patient is COVID negative ER provider is concerned and would like patient to be retested in a.m. Later on respiratory panel came back with rhinovirus which is likely explaining patient's respiratory symptoms   OB/GYN will see patient in consult recommend an ultrasound to further evaluate if patient will need aggressive surgical intervention IV antibiotics cefotaxime and doxycycline initiated in emergency Assessment & Plan:   Active Problems:   PID (acute pelvic inflammatory disease)  #1 acute PID-transvaginal ultrasound shows bilateral hydrosalpinx consistent with PID and large complex cyst posterior to the uterus possible  tubo-ovarian abscess on ultrasound.  Patient seen by GYN.  They feel this most likely represents a cyst that patient has had in the past.  Therefore no surgery recommended at this time and continue IV antibiotics.  Chlamydia and Neisseria gonorrhea negative.  #2 upper respiratory illness most likely viral no evidence of pneumonia covid is negative.  Rhinovirus positive respiratory virus panel.  #3 polysubstance abuse counseled against cessation  #4 hypomagnesemia replete and recheck     Estimated body mass index is 29.26 kg/m as calculated from the following:   Height as of this encounter: 5\' 2"  (1.575 m).   Weight as of this encounter: 72.6 kg.  DVT prophylaxis: SCD Code Status: Full code Family Communication: Discussed with patient disposition Plan pending clinical improvement Consultants:   GYN  Procedures: None  Antimicrobials: Doxycycline and cefoxitin   Subjective: Patient  Objective: Vitals:   01/12/19 0700 01/12/19 0751 01/12/19 1000 01/12/19 1228  BP: 113/82 113/82 124/83 117/72  Pulse: 84 92 94 87  Resp:  20 18 17   Temp:  99.7 F (37.6 C) 98.6 F (37 C) 99 F (37.2 C)  TempSrc:  Oral Oral Oral  SpO2: 96% 96% 98% 99%  Weight:      Height:        Intake/Output Summary (Last 24 hours) at 01/12/2019 1523 Last data filed at 01/12/2019 1056 Gross per 24 hour  Intake 871.01 ml  Output --  Net 871.01 ml   Filed Weights   01/11/19 1143  Weight: 72.6 kg    Examination:  General exam: Appears calm and comfortable  Respiratory system: Clear to auscultation. Respiratory effort  normal. Cardiovascular system: S1 & S2 heard, RRR. No JVD, murmurs, rubs, gallops or clicks. No pedal edema. Gastrointestinal system: Abdomen is nondistended, soft and nontender. No organomegaly or masses felt. Normal bowel sounds heard. Central nervous system: Alert and oriented. No focal neurological deficits. Extremities: Symmetric 5 x 5 power. Skin: No rashes, lesions or  ulcers Psychiatry: Judgement and insight appear normal. Mood & affect appropriate.     Data Reviewed: I have personally reviewed following labs and imaging studies  CBC: Recent Labs  Lab 01/11/19 1324 01/12/19 0231  WBC 23.9* 22.3*  NEUTROABS 18.7*  --   HGB 13.5 12.6  HCT 42.0 39.4  MCV 86.1 87.0  PLT 261 [id number]   Basic Metabolic Panel: Recent Labs  Lab 01/11/19 1324 01/12/19 0231  NA 136 137  K 3.8 3.7  CL 104 102  CO2 22 22  GLUCOSE 101* 73  BUN 7 5*  CREATININE 0.73 0.92  CALCIUM 8.6* 8.2*  MG  --  1.5*  PHOS  --  3.5   GFR: Estimated Creatinine Clearance: 81.9 mL/min (by C-G formula based on SCr of 0.92 mg/dL). Liver Function Tests: Recent Labs  Lab 01/11/19 1324 01/12/19 0231  AST 13* 13*  ALT 11 6  ALKPHOS 72 87  BILITOT 0.7 0.7  PROT 7.1 6.5  ALBUMIN 3.5 3.2*   Recent Labs  Lab 01/11/19 1324  LIPASE 24   No results for input(s): AMMONIA in the last 168 hours. Coagulation Profile: No results for input(s): INR, PROTIME in the last 168 hours. Cardiac Enzymes: No results for input(s): CKTOTAL, CKMB, CKMBINDEX, TROPONINI in the last 168 hours. BNP (last 3 results) No results for input(s): PROBNP in the last 8760 hours. HbA1C: No results for input(s): HGBA1C in the last 72 hours. CBG: No results for input(s): GLUCAP in the last 168 hours. Lipid Profile: No results for input(s): CHOL, HDL, LDLCALC, TRIG, CHOLHDL, LDLDIRECT in the last 72 hours. Thyroid Function Tests: Recent Labs    01/12/19 0231  TSH 1.007   Anemia Panel: No results for input(s): VITAMINB12, FOLATE, FERRITIN, TIBC, IRON, RETICCTPCT in the last 72 hours. Sepsis Labs: Recent Labs  Lab 01/11/19 1324  LATICACIDVEN 0.7    Recent Results (from the past 240 hour(s))  SARS CORONAVIRUS 2 (TAT 6-24 HRS) Nasopharyngeal Nasopharyngeal Swab     Status: None   Collection Time: 01/11/19  1:24 PM   Specimen: Nasopharyngeal Swab  Result Value Ref Range Status   SARS Coronavirus 2  NEGATIVE NEGATIVE Final    Comment: (NOTE) SARS-CoV-2 target nucleic acids are NOT DETECTED. The SARS-CoV-2 RNA is generally detectable in upper and lower respiratory specimens during the acute phase of infection. Negative results do not preclude SARS-CoV-2 infection, do not rule out co-infections with other pathogens, and should not be used as the sole basis for treatment or other patient management decisions. Negative results must be combined with clinical observations, patient history, and epidemiological information. The expected result is Negative. Fact Sheet for Patients: SugarRoll.be Fact Sheet for Healthcare Providers: https://www.[employer].com/ This test is not yet approved or cleared by the [place] FDA and  has been authorized for detection and/or diagnosis of SARS-CoV-2 by FDA under an Emergency Use Authorization (EUA). This EUA will remain  in effect (meaning this test can be used) for the duration of the COVID-19 declaration under Section 56 4(b)(1) of the Act, 21 U.S.C. section 360bbb-3(b)(1), unless the authorization is terminated or revoked sooner. Performed at [facility], [place] [address],  [place], [place] [address]   Wet prep, genital     Status: Abnormal   Collection Time: 01/11/19  3:18 PM   Specimen: PATH Cytology Cervicovaginal Ancillary Only  Result Value Ref Range Status   Yeast Wet Prep HPF POC NONE SEEN NONE SEEN Final   Trich, Wet Prep NONE SEEN NONE SEEN Final   Clue Cells Wet Prep HPF POC NONE SEEN NONE SEEN Final   WBC, Wet Prep HPF POC FEW (A) NONE SEEN Final    Comment: Specimen diluted due to transport tube containing more than 1 ml of saline, interpret results with caution.   Sperm NONE SEEN  Final    Comment: Performed at [facility], [place] [address], [place], [place] [address]  Respiratory Panel by PCR     Status: Abnormal   Collection Time: 01/11/19  7:02 PM   Specimen:  Nasopharyngeal Swab; Respiratory  Result Value Ref Range Status   Adenovirus NOT DETECTED NOT DETECTED Final   Coronavirus 229E NOT DETECTED NOT DETECTED Final    Comment: (NOTE) The Coronavirus on the Respiratory Panel, DOES NOT test for the novel  Coronavirus (2019 nCoV)    Coronavirus HKU1 NOT DETECTED NOT DETECTED Final   Coronavirus NL63 NOT DETECTED NOT DETECTED Final   Coronavirus OC43 NOT DETECTED NOT DETECTED Final   Metapneumovirus NOT DETECTED NOT DETECTED Final   Rhinovirus / Enterovirus DETECTED (A) NOT DETECTED Final   Influenza A NOT DETECTED NOT DETECTED Final   Influenza B NOT DETECTED NOT DETECTED Final   Parainfluenza Virus 1 NOT DETECTED NOT DETECTED Final   Parainfluenza Virus 2 NOT DETECTED NOT DETECTED Final   Parainfluenza Virus 3 NOT DETECTED NOT DETECTED Final   Parainfluenza Virus 4 NOT DETECTED NOT DETECTED Final   Respiratory Syncytial Virus NOT DETECTED NOT DETECTED Final   Bordetella pertussis NOT DETECTED NOT DETECTED Final   Chlamydophila pneumoniae NOT DETECTED NOT DETECTED Final   Mycoplasma pneumoniae NOT DETECTED NOT DETECTED Final    Comment: Performed at [facility], [place]. [address], [place], [place] [address]         Radiology Studies: US Transvaginal Non-ob  Result Date: 01/11/2019 CLINICAL DATA:  Abdominal pain with abnormal CT EXAM: TRANSABDOMINAL AND TRANSVAGINAL ULTRASOUND OF PELVIS DOPPLER ULTRASOUND OF OVARIES TECHNIQUE: Both transabdominal and transvaginal ultrasound examinations of the pelvis were performed. Transabdominal technique was performed for global imaging of the pelvis including uterus, ovaries, adnexal regions, and pelvic cul-de-sac. It was necessary to proceed with endovaginal exam following the transabdominal exam to visualize the adnexa. Color and duplex Doppler ultrasound was utilized to evaluate blood flow to the ovaries. COMPARISON:  CT 01/11/2019, ultrasound 03/26/2018, CT 03/26/2018 FINDINGS: Uterus  Measurements: 11.2 x 5.8 x 6.1 cm = volume: 204.5 mL. Multiple large uterine fibroids. Exophytic fundal fibroid measures 6.8 x 6 x 6.5 cm. Pedunculated fibroid off the lower anterior uterine wall measures 6.2 x 4.5 x 5.7 cm. Endometrium Thickness: 6.3 mm.  No focal abnormality visualized. Right ovary Measurements: 2.7 x 1.8 x 2.2 cm = volume: 5.5 mL. Tortuous tubular structure in the right adnexa, felt consistent with hydrosalpinx. Slightly thickened wall and minimal scattered internal echoes. Left ovary Measurements: 5.4 x 2.5 x 3.6 cm = volume: 25.4 mL. Dilated tubular structure in the left adnexa with slightly thickened wall, felt consistent with hydrosalpinx. Few internal scattered echoes. Posterior to the cervix is a large complex cyst measuring 10.6 x 8.1 x 8.7 cm with flow at the periphery. Cyst contains internal  scattered echoes and slightly echogenic debris. Previously this was more homogeneous and measured 8.1 x 8.9 by 7.5 cm. Pulsed Doppler evaluation of both ovaries demonstrates normal low-resistance arterial and venous waveforms. Other findings No abnormal free fluid. IMPRESSION: 1. Negative for ovarian torsion 2. Bilateral hydrosalpinx with slightly thickened wall and scattered internal echoes which could be secondary to acute inflammation/infection/acute PID. 3. Large 10.6 cm complex cyst posterior to the cervix, associated with left adnexa. Cyst appears more thick walled with less homogeneous internal echoes and is slightly increased in size. Mass remains indeterminate, but could represent tubo-ovarian abscess given inflammatory changes at the adnexa. 4. Large uterine fibroids Electronically Signed   By: Donavan Foil M.D.   On: 01/11/2019 20:10   US Pelvis Complete  Result Date: 01/11/2019 CLINICAL DATA:  Abdominal pain with abnormal CT EXAM: TRANSABDOMINAL AND TRANSVAGINAL ULTRASOUND OF PELVIS DOPPLER ULTRASOUND OF OVARIES TECHNIQUE: Both transabdominal and transvaginal ultrasound examinations  of the pelvis were performed. Transabdominal technique was performed for global imaging of the pelvis including uterus, ovaries, adnexal regions, and pelvic cul-de-sac. It was necessary to proceed with endovaginal exam following the transabdominal exam to visualize the adnexa. Color and duplex Doppler ultrasound was utilized to evaluate blood flow to the ovaries. COMPARISON:  CT 01/11/2019, ultrasound 03/26/2018, CT 03/26/2018 FINDINGS: Uterus Measurements: 11.2 x 5.8 x 6.1 cm = volume: 204.5 mL. Multiple large uterine fibroids. Exophytic fundal fibroid measures 6.8 x 6 x 6.5 cm. Pedunculated fibroid off the lower anterior uterine wall measures 6.2 x 4.5 x 5.7 cm. Endometrium Thickness: 6.3 mm.  No focal abnormality visualized. Right ovary Measurements: 2.7 x 1.8 x 2.2 cm = volume: 5.5 mL. Tortuous tubular structure in the right adnexa, felt consistent with hydrosalpinx. Slightly thickened wall and minimal scattered internal echoes. Left ovary Measurements: 5.4 x 2.5 x 3.6 cm = volume: 25.4 mL. Dilated tubular structure in the left adnexa with slightly thickened wall, felt consistent with hydrosalpinx. Few internal scattered echoes. Posterior to the cervix is a large complex cyst measuring 10.6 x 8.1 x 8.7 cm with flow at the periphery. Cyst contains internal scattered echoes and slightly echogenic debris. Previously this was more homogeneous and measured 8.1 x 8.9 by 7.5 cm. Pulsed Doppler evaluation of both ovaries demonstrates normal low-resistance arterial and venous waveforms. Other findings No abnormal free fluid. IMPRESSION: 1. Negative for ovarian torsion 2. Bilateral hydrosalpinx with slightly thickened wall and scattered internal echoes which could be secondary to acute inflammation/infection/acute PID. 3. Large 10.6 cm complex cyst posterior to the cervix, associated with left adnexa. Cyst appears more thick walled with less homogeneous internal echoes and is slightly increased in size. Mass remains  indeterminate, but could represent tubo-ovarian abscess given inflammatory changes at the adnexa. 4. Large uterine fibroids Electronically Signed   By: Donavan Foil M.D.   On: 01/11/2019 20:10   Ct Abdomen Pelvis W Contrast  Result Date: 01/11/2019 CLINICAL DATA:  Acute onset abdominal pain beginning last night. Suspected appendicitis. EXAM: CT ABDOMEN AND PELVIS WITH CONTRAST TECHNIQUE: Multidetector CT imaging of the abdomen and pelvis was performed using the standard protocol following bolus administration of intravenous contrast. CONTRAST:  124mL OMNIPAQUE IOHEXOL 300 MG/ML SOLN, <See Chart> OMNIPAQUE IOHEXOL 300 MG/ML SOLN COMPARISON:  03/26/2018 FINDINGS: Lower Chest: No acute findings. Hepatobiliary: No hepatic masses identified. Gallbladder is unremarkable. No evidence of biliary ductal dilatation. Pancreas:  No mass or inflammatory changes. Spleen: Within normal limits in size and appearance. Adrenals/Urinary Tract: No masses identified. No evidence of hydronephrosis.  Stomach/Bowel: No evidence of obstruction, inflammatory process or abnormal fluid collections. Normal appendix visualized. Vascular/Lymphatic: No pathologically enlarged lymph nodes. No abdominal aortic aneurysm. Reproductive: Several uterine fibroids are again seen. Largest in the anterior lower uterine segment measures 9 cm in maximum diameter. Another fibroid in the fundus measures 6 cm and shows diffuse hypervascular enhancement. Right-sided hydrosalpinx shows mild increase in size, with increased mural enhancement and mild adjacent inflammatory changes, consistent with tubo-ovarian abscess. Left hydrosalpinx shows no significant change. A complex cystic lesion which contains several enhancing internal septations is again seen in the pelvic cul-de-sac, currently measuring 9.5 x 7.7 cm compared to 9.2 x 7.9 cm previously. This likely represents a chronic left tubo-ovarian abscess, with cystic neoplasm of the left ovary considered less  likely. No evidence of free fluid. Other:  None. Musculoskeletal:  No suspicious bone lesions identified. IMPRESSION: Mild increase in size of right hydrosalpinx with mild adjacent inflammatory changes, consistent with acute pelvic inflammatory disease. Stable left hydrosalpinx and 9.5 cm complex cystic lesion in pelvic cul-de-sac, likely representing chronic left tubo-ovarian abscess, with cystic neoplasm of left ovary considered less likely. Multiple uterine fibroids. No evidence of appendicitis. Electronically Signed   By: Marlaine Hind M.D.   On: 01/11/2019 17:22   [place] Art/ven Flow Abd Pelv Doppler  Result Date: 01/11/2019 CLINICAL DATA:  Abdominal pain with abnormal CT EXAM: TRANSABDOMINAL AND TRANSVAGINAL ULTRASOUND OF PELVIS DOPPLER ULTRASOUND OF OVARIES TECHNIQUE: Both transabdominal and transvaginal ultrasound examinations of the pelvis were performed. Transabdominal technique was performed for global imaging of the pelvis including uterus, ovaries, adnexal regions, and pelvic cul-de-sac. It was necessary to proceed with endovaginal exam following the transabdominal exam to visualize the adnexa. Color and duplex Doppler ultrasound was utilized to evaluate blood flow to the ovaries. COMPARISON:  CT 01/11/2019, ultrasound 03/26/2018, CT 03/26/2018 FINDINGS: Uterus Measurements: 11.2 x 5.8 x 6.1 cm = volume: 204.5 mL. Multiple large uterine fibroids. Exophytic fundal fibroid measures 6.8 x 6 x 6.5 cm. Pedunculated fibroid off the lower anterior uterine wall measures 6.2 x 4.5 x 5.7 cm. Endometrium Thickness: 6.3 mm.  No focal abnormality visualized. Right ovary Measurements: 2.7 x 1.8 x 2.2 cm = volume: 5.5 mL. Tortuous tubular structure in the right adnexa, felt consistent with hydrosalpinx. Slightly thickened wall and minimal scattered internal echoes. Left ovary Measurements: 5.4 x 2.5 x 3.6 cm = volume: 25.4 mL. Dilated tubular structure in the left adnexa with slightly thickened wall, felt consistent  with hydrosalpinx. Few internal scattered echoes. Posterior to the cervix is a large complex cyst measuring 10.6 x 8.1 x 8.7 cm with flow at the periphery. Cyst contains internal scattered echoes and slightly echogenic debris. Previously this was more homogeneous and measured 8.1 x 8.9 by 7.5 cm. Pulsed Doppler evaluation of both ovaries demonstrates normal low-resistance arterial and venous waveforms. Other findings No abnormal free fluid. IMPRESSION: 1. Negative for ovarian torsion 2. Bilateral hydrosalpinx with slightly thickened wall and scattered internal echoes which could be secondary to acute inflammation/infection/acute PID. 3. Large 10.6 cm complex cyst posterior to the cervix, associated with left adnexa. Cyst appears more thick walled with less homogeneous internal echoes and is slightly increased in size. Mass remains indeterminate, but could represent tubo-ovarian abscess given inflammatory changes at the adnexa. 4. Large uterine fibroids Electronically Signed   By: Donavan Foil M.D.   On: 01/11/2019 20:10   Dg Chest Port 1 View  Result Date: 01/11/2019 CLINICAL DATA:  Tachycardia with cough and fevers EXAM: PORTABLE CHEST  1 VIEW COMPARISON:  None. FINDINGS: Lungs are clear. Heart size and pulmonary vascularity are normal. No adenopathy. No pneumothorax. No bone lesions. IMPRESSION: No edema or consolidation. Electronically Signed   By: Lowella Grip III M.D.   On: 01/11/2019 13:41        Scheduled Meds:  doxycycline  100 mg Oral Q12H   Continuous Infusions:  cefOXitin 2 g (01/12/19 1056)     LOS: 1 day     Georgette Shell, MD [facility]  If 7PM-7AM, please contact night-coverage [email or web address] Password [email or web address] 01/12/2019, 3:23 PM

## 2019-01-12 NOTE — ED Notes (Signed)
Pt placed on hospital bed for comfort.

## 2019-01-12 NOTE — Progress Notes (Signed)
HD # 1 ? PID+/- TOA  Subjective: Reports feels a little better today. Pain medication helps. Still with cough.   Tolerating diet  Objective: I have reviewed patient's vital signs.  Lungs clear Heart RRR Abd soft + BS  Abd/pelvic mass affect 18-20 weeks, diffuse tenderness, no rebound   Assessment/Plan: PID +/- TOA Uterine fibroids Bilateral hydrosalpinx's ? Endometriosis URI, viral  Will continue with antibiotics for 36-48 hours. If no improvement consider IR for eval.    LOS: 1 day    Chancy Milroy 01/12/2019, 4:14 PM

## 2019-01-13 DIAGNOSIS — Z20828 Contact with and (suspected) exposure to other viral communicable diseases: Secondary | ICD-10-CM

## 2019-01-13 DIAGNOSIS — N7093 Salpingitis and oophoritis, unspecified: Secondary | ICD-10-CM

## 2019-01-13 DIAGNOSIS — A419 Sepsis, unspecified organism: Secondary | ICD-10-CM

## 2019-01-13 DIAGNOSIS — Z20822 Contact with and (suspected) exposure to covid-19: Secondary | ICD-10-CM

## 2019-01-13 DIAGNOSIS — N730 Acute parametritis and pelvic cellulitis: Secondary | ICD-10-CM

## 2019-01-13 LAB — CBC WITH DIFFERENTIAL/PLATELET
Abs Immature Granulocytes: 0.1 10*3/uL — ABNORMAL HIGH (ref 0.00–0.07)
Basophils Absolute: 0 10*3/uL (ref 0.0–0.1)
Basophils Relative: 0 %
Eosinophils Absolute: 0.1 10*3/uL (ref 0.0–0.5)
Eosinophils Relative: 0 %
HCT: 36.7 % (ref 36.0–46.0)
Hemoglobin: 11.6 g/dL — ABNORMAL LOW (ref 12.0–15.0)
Immature Granulocytes: 1 %
Lymphocytes Relative: 16 %
Lymphs Abs: 3 10*3/uL (ref 0.7–4.0)
MCH: 27.1 pg (ref 26.0–34.0)
MCHC: 31.6 g/dL (ref 30.0–36.0)
MCV: 85.7 fL (ref 80.0–100.0)
Monocytes Absolute: 1.8 10*3/uL — ABNORMAL HIGH (ref 0.1–1.0)
Monocytes Relative: 10 %
Neutro Abs: 13.9 10*3/uL — ABNORMAL HIGH (ref 1.7–7.7)
Neutrophils Relative %: 73 %
Platelets: 247 10*3/uL (ref 150–400)
RBC: 4.28 MIL/uL (ref 3.87–5.11)
RDW: 14.2 % (ref 11.5–15.5)
WBC: 18.9 10*3/uL — ABNORMAL HIGH (ref 4.0–10.5)
nRBC: 0 % (ref 0.0–0.2)

## 2019-01-13 LAB — COMPREHENSIVE METABOLIC PANEL
ALT: 10 U/L (ref 0–44)
AST: 11 U/L — ABNORMAL LOW (ref 15–41)
Albumin: 2.8 g/dL — ABNORMAL LOW (ref 3.5–5.0)
Alkaline Phosphatase: 67 U/L (ref 38–126)
Anion gap: 9 (ref 5–15)
BUN: <5 mg/dL — ABNORMAL LOW (ref 6–20)
CO2: 24 mmol/L (ref 22–32)
Calcium: 7.8 mg/dL — ABNORMAL LOW (ref 8.9–10.3)
Chloride: 103 mmol/L (ref 98–111)
Creatinine, Ser: 0.88 mg/dL (ref 0.44–1.00)
GFR calc Af Amer: >60 mL/min (ref >60)
GFR calc non Af Amer: >60 mL/min (ref >60)
Glucose, Bld: 87 mg/dL (ref 70–99)
Potassium: 3.1 mmol/L — ABNORMAL LOW (ref 3.5–5.1)
Sodium: 136 mmol/L (ref 135–145)
Total Bilirubin: 1 mg/dL (ref 0.3–1.2)
Total Protein: 6.3 g/dL — ABNORMAL LOW (ref 6.5–8.1)

## 2019-01-13 LAB — MAGNESIUM: Magnesium: 1.6 mg/dL — ABNORMAL LOW (ref 1.7–2.4)

## 2019-01-13 MED ORDER — MAGNESIUM SULFATE 4 GM/100ML IV SOLN
4.0000 g | Freq: Once | INTRAVENOUS | Status: AC
  Administered 2019-01-13: 4 g via INTRAVENOUS
  Filled 2019-01-13: qty 100

## 2019-01-13 MED ORDER — IBUPROFEN 800 MG PO TABS: 800.0000 mg | ORAL_TABLET | Freq: Three times a day (TID) | ORAL | Status: DC

## 2019-01-13 MED ORDER — ZOLPIDEM TARTRATE 5 MG PO TABS: 5.0000 mg | ORAL_TABLET | Freq: Every evening | ORAL | Status: DC | PRN

## 2019-01-13 MED ORDER — POTASSIUM CHLORIDE CRYS ER 20 MEQ PO TBCR
40.0000 meq | EXTENDED_RELEASE_TABLET | Freq: Once | ORAL | Status: AC
  Administered 2019-01-13: 40 meq via ORAL
  Filled 2019-01-13: qty 2

## 2019-01-13 MED ORDER — HYDROMORPHONE HCL 1 MG/ML IJ SOLN
1.0000 mg | INTRAMUSCULAR | Status: DC | PRN
  Administered 2019-01-13 – 2019-01-14 (×2): 1 mg via INTRAVENOUS
  Administered 2019-01-14: 2 mg via INTRAVENOUS
  Administered 2019-01-15: 1 mg via INTRAVENOUS
  Filled 2019-01-13 (×3): qty 1
  Filled 2019-01-13: qty 2

## 2019-01-13 MED ORDER — PANTOPRAZOLE SODIUM 40 MG PO TBEC
40.0000 mg | DELAYED_RELEASE_TABLET | Freq: Every day | ORAL | Status: DC
  Administered 2019-01-13 – 2019-01-15 (×3): 40 mg via ORAL
  Filled 2019-01-13 (×3): qty 1

## 2019-01-13 MED ORDER — DOCUSATE SODIUM 100 MG PO CAPS
200.0000 mg | ORAL_CAPSULE | Freq: Every day | ORAL | Status: DC
  Administered 2019-01-13 – 2019-01-15 (×3): 200 mg via ORAL
  Filled 2019-01-13 (×3): qty 2

## 2019-01-13 NOTE — Progress Notes (Signed)
PROGRESS NOTE    Joanna Smith  [id number] DOB: [birth year] DOA: 01/11/2019 PCP: Patient, No Pcp Per  Brief Narrative:33 y.o.femalewith medical history significant Of PID, TOA   Presented withabdominal pain congestion no nausea vomiting no diarrhea has had significant cough for past 4 days occasionally productive of sputum she had initially cold-like symptoms last week but now settled into her chest. She has occasional chest pain with coughing. No leg swelling no hemoptysis no personal history of PE or DVT. Patient denied any sick contacts she try to use Zyrtec but did not seem to help Having fevers last night and feels like her abdomen right lower quadrant pain feels similar to when she had fluid in her fallopian tubes in the past no vaginal discharge  Patient initially stated to emergency department that [facility] virus testing has already virus on them and that she felt"set up because she is a black woman"for being tested for coronavirus She eventually agreed to get tested   While in ER: CT abdomen showed evidence of PID as well as left hydrosalpinx and 9.5 cm complex cystic lesion of pelvic cul-de-sac likely secondary to chronic left tubo-ovarian abscess OB/GYN was consulted requesting medicine to admit given respiratory symptoms although patient is COVID negative ER provider is concerned and would like patient to be retested in a.m. Later on respiratory panel came back with rhinovirus which is likely explaining patient's respiratory symptoms   OB/GYN will see patient in consult recommend an ultrasound to further evaluate if patient will need aggressive surgical intervention IV antibiotics cefotaxime and doxycycline initiated in emergency  Assessment & Plan:   Active Problems:   PID (acute pelvic inflammatory disease)  #1 acute PID-transvaginal ultrasound shows bilateral hydrosalpinx consistent with PID and large complex cyst posterior to the uterus possible  tubo-ovarian abscess on ultrasound.  Patient seen by GYN.  They feel this most likely represents a cyst that patient has had in the past.  Therefore no surgery recommended at this time and continue IV doxycycline and cefoxitin antibiotics.  Chlamydia and Neisseria gonorrhea negative.  GYN has consulted interventional radiology to drain the fluid collection.  Leukocytosis improving.  Patient has been afebrile.  CT abdomen and pelvis Mild increase in size of right hydrosalpinx with mild adjacent inflammatory changes, consistent with acute pelvic inflammatory disease.  Stable left hydrosalpinx and 9.5 cm complex cystic lesion in pelvic cul-de-sac, likely representing chronic left tubo-ovarian abscess, with cystic neoplasm of left ovary considered less likely.  Multiple uterine fibroids.  No evidence of appendicitis.  #2 upper respiratory illness most likely viral no evidence of pneumonia covid is negative.  Rhinovirus positive respiratory virus panel.  #3 polysubstance abuse counseled against cessation  #4 hypomagnesemia replete and recheck  #5 gastritis DC Advil start PPI  #6 constipation start Colace  #7 hypokalemia replete and recheck.  Estimated body mass index is 29.26 kg/m as calculated from the following:   Height as of this encounter: 5\' 2"  (1.575 m).   Weight as of this encounter: 72.6 kg.   Subjective: Appetite decreased complaints of pain when food hits her stomach no history of gastritis or ulcer that she knows of Objective: Vitals:   01/12/19 1829 01/12/19 2320 01/13/19 0556 01/13/19 1228  BP: 124/82 129/79 130/72 127/78  Pulse: 85 91 75 88  Resp: 18 18 16 18   Temp: 98.6 F (37 C) 98.2 F (36.8 C) (!) 97.5 F (36.4 C) 99 F (37.2 C)  TempSrc: Oral Oral Oral Oral  SpO2: 100% 99% 98% 99%  Weight:      Height:        Intake/Output Summary (Last 24 hours) at 01/13/2019 1457 Last data filed at 01/13/2019 0555 Gross per 24 hour  Intake 550 ml    Output --  Net 550 ml   Filed Weights   01/11/19 1143  Weight: 72.6 kg    Examination:  General exam: Appears calm and comfortable  Respiratory system: Clear to auscultation. Respiratory effort normal. Cardiovascular system: S1 & S2 heard, RRR. No JVD, murmurs, rubs, gallops or clicks. No pedal edema. Gastrointestinal system: Abdomen is nondistended, soft and tender. No organomegaly or masses felt. Normal bowel sounds heard.  Right lower quadrant tenderness no rebound or guarding Central nervous system: Alert and oriented. No focal neurological deficits. Extremities: Symmetric 5 x 5 power. Skin: No rashes, lesions or ulcers Psychiatry: Judgement and insight appear normal. Mood & affect appropriate.     Data Reviewed: I have personally reviewed following labs and imaging studies  CBC: Recent Labs  Lab 01/11/19 1324 01/12/19 0231 01/13/19 0458  WBC 23.9* 22.3* 18.9*  NEUTROABS 18.7*  --  13.9*  HGB 13.5 12.6 11.6*  HCT 42.0 39.4 36.7  MCV 86.1 87.0 85.7  PLT 261 268 [id number]   Basic Metabolic Panel: Recent Labs  Lab 01/11/19 1324 01/12/19 0231 01/13/19 0458  NA 136 137 136  K 3.8 3.7 3.1*  CL 104 102 103  CO2 22 22 24   GLUCOSE 101* 73 87  BUN 7 5* <5*  CREATININE 0.73 0.92 0.88  CALCIUM 8.6* 8.2* 7.8*  MG  --  1.5* 1.6*  PHOS  --  3.5  --    GFR: Estimated Creatinine Clearance: 85.6 mL/min (by C-G formula based on SCr of 0.88 mg/dL). Liver Function Tests: Recent Labs  Lab 01/11/19 1324 01/12/19 0231 01/13/19 0458  AST 13* 13* 11*  ALT 11 6 10   ALKPHOS 72 87 67  BILITOT 0.7 0.7 1.0  PROT 7.1 6.5 6.3*  ALBUMIN 3.5 3.2* 2.8*   Recent Labs  Lab 01/11/19 1324  LIPASE 24   No results for input(s): AMMONIA in the last 168 hours. Coagulation Profile: No results for input(s): INR, PROTIME in the last 168 hours. Cardiac Enzymes: No results for input(s): CKTOTAL, CKMB, CKMBINDEX, TROPONINI in the last 168 hours. BNP (last 3 results) No results for  input(s): PROBNP in the last 8760 hours. HbA1C: No results for input(s): HGBA1C in the last 72 hours. CBG: No results for input(s): GLUCAP in the last 168 hours. Lipid Profile: No results for input(s): CHOL, HDL, LDLCALC, TRIG, CHOLHDL, LDLDIRECT in the last 72 hours. Thyroid Function Tests: Recent Labs    01/12/19 0231  TSH 1.007   Anemia Panel: No results for input(s): VITAMINB12, FOLATE, FERRITIN, TIBC, IRON, RETICCTPCT in the last 72 hours. Sepsis Labs: Recent Labs  Lab 01/11/19 1324  LATICACIDVEN 0.7    Recent Results (from the past 240 hour(s))  SARS CORONAVIRUS 2 (TAT 6-24 HRS) Nasopharyngeal Nasopharyngeal Swab     Status: None   Collection Time: 01/11/19  1:24 PM   Specimen: Nasopharyngeal Swab  Result Value Ref Range Status   SARS Coronavirus 2 NEGATIVE NEGATIVE Final    Comment: (NOTE) SARS-CoV-2 target nucleic acids are NOT DETECTED. The SARS-CoV-2 RNA is generally detectable in upper and lower respiratory specimens during the acute phase of infection. Negative results do not preclude SARS-CoV-2 infection, do not rule out co-infections with other pathogens, and should not be used as the sole basis for treatment or  other patient management decisions. Negative results must be combined with clinical observations, patient history, and epidemiological information. The expected result is Negative. Fact Sheet for Patients: SugarRoll.be Fact Sheet for Healthcare Providers: https://www.[facility].com/ This test is not yet approved or cleared by the [place] FDA and  has been authorized for detection and/or diagnosis of SARS-CoV-2 by FDA under an Emergency Use Authorization (EUA). This EUA will remain  in effect (meaning this test can be used) for the duration of the COVID-19 declaration under Section 56 4(b)(1) of the Act, 21 U.S.C. section 360bbb-3(b)(1), unless the authorization is terminated or revoked  sooner. Performed at [facility], [place] [address] [place], [place] [address]   Wet prep, genital     Status: Abnormal   Collection Time: 01/11/19  3:18 PM   Specimen: PATH Cytology Cervicovaginal Ancillary Only  Result Value Ref Range Status   Yeast Wet Prep HPF POC NONE SEEN NONE SEEN Final   Trich, Wet Prep NONE SEEN NONE SEEN Final   Clue Cells Wet Prep HPF POC NONE SEEN NONE SEEN Final   WBC, Wet Prep HPF POC FEW (A) NONE SEEN Final    Comment: Specimen diluted due to transport tube containing more than 1 ml of saline, interpret results with caution.   Sperm NONE SEEN  Final    Comment: Performed at [facility], [place] [address], [place], [place] [address]  Culture, blood (routine x 2)     Status: None (Preliminary result)   Collection Time: 01/11/19  6:10 PM   Specimen: BLOOD  Result Value Ref Range Status   Specimen Description BLOOD RIGHT ANTECUBITAL  Final   Special Requests   Final    BOTTLES DRAWN AEROBIC AND ANAEROBIC Blood Culture adequate volume   Culture   Final    NO GROWTH < 24 HOURS Performed at [facility], [place] [address], [place], [place] [address]    Report Status PENDING  Incomplete  Culture, blood (routine x 2)     Status: None (Preliminary result)   Collection Time: 01/11/19  6:15 PM   Specimen: BLOOD  Result Value Ref Range Status   Specimen Description BLOOD LEFT ANTECUBITAL  Final   Special Requests   Final    BOTTLES DRAWN AEROBIC AND ANAEROBIC Blood Culture results may not be optimal due to an excessive volume of blood received in culture bottles   Culture   Final    NO GROWTH < 24 HOURS Performed at [facility], [place] [address], [place], [place] [address]    Report Status PENDING  Incomplete  Respiratory Panel by PCR     Status: Abnormal   Collection Time: 01/11/19  7:02 PM   Specimen: Nasopharyngeal Swab; Respiratory  Result Value Ref Range Status   Adenovirus NOT DETECTED NOT DETECTED Final   Coronavirus 229E NOT  DETECTED NOT DETECTED Final    Comment: (NOTE) The Coronavirus on the Respiratory Panel, DOES NOT test for the novel  Coronavirus (2019 nCoV)    Coronavirus HKU1 NOT DETECTED NOT DETECTED Final   Coronavirus NL63 NOT DETECTED NOT DETECTED Final   Coronavirus OC43 NOT DETECTED NOT DETECTED Final   Metapneumovirus NOT DETECTED NOT DETECTED Final   Rhinovirus / Enterovirus DETECTED (A) NOT DETECTED Final   Influenza A NOT DETECTED NOT DETECTED Final   Influenza B NOT DETECTED NOT DETECTED Final   Parainfluenza Virus 1 NOT DETECTED NOT DETECTED Final   Parainfluenza Virus 2 NOT DETECTED NOT DETECTED  Final   Parainfluenza Virus 3 NOT DETECTED NOT DETECTED Final   Parainfluenza Virus 4 NOT DETECTED NOT DETECTED Final   Respiratory Syncytial Virus NOT DETECTED NOT DETECTED Final   Bordetella pertussis NOT DETECTED NOT DETECTED Final   Chlamydophila pneumoniae NOT DETECTED NOT DETECTED Final   Mycoplasma pneumoniae NOT DETECTED NOT DETECTED Final    Comment: Performed at [facility], [place] [address], [place], [place] [address]         Radiology Studies: US Transvaginal Non-ob  Result Date: 01/11/2019 CLINICAL DATA:  Abdominal pain with abnormal CT EXAM: TRANSABDOMINAL AND TRANSVAGINAL ULTRASOUND OF PELVIS DOPPLER ULTRASOUND OF OVARIES TECHNIQUE: Both transabdominal and transvaginal ultrasound examinations of the pelvis were performed. Transabdominal technique was performed for global imaging of the pelvis including uterus, ovaries, adnexal regions, and pelvic cul-de-sac. It was necessary to proceed with endovaginal exam following the transabdominal exam to visualize the adnexa. Color and duplex Doppler ultrasound was utilized to evaluate blood flow to the ovaries. COMPARISON:  CT 01/11/2019, ultrasound 03/26/2018, CT 03/26/2018 FINDINGS: Uterus Measurements: 11.2 x 5.8 x 6.1 cm = volume: 204.5 mL. Multiple large uterine fibroids. Exophytic fundal fibroid measures 6.8 x 6 x 6.5 cm.  Pedunculated fibroid off the lower anterior uterine wall measures 6.2 x 4.5 x 5.7 cm. Endometrium Thickness: 6.3 mm.  No focal abnormality visualized. Right ovary Measurements: 2.7 x 1.8 x 2.2 cm = volume: 5.5 mL. Tortuous tubular structure in the right adnexa, felt consistent with hydrosalpinx. Slightly thickened wall and minimal scattered internal echoes. Left ovary Measurements: 5.4 x 2.5 x 3.6 cm = volume: 25.4 mL. Dilated tubular structure in the left adnexa with slightly thickened wall, felt consistent with hydrosalpinx. Few internal scattered echoes. Posterior to the cervix is a large complex cyst measuring 10.6 x 8.1 x 8.7 cm with flow at the periphery. Cyst contains internal scattered echoes and slightly echogenic debris. Previously this was more homogeneous and measured 8.1 x 8.9 by 7.5 cm. Pulsed Doppler evaluation of both ovaries demonstrates normal low-resistance arterial and venous waveforms. Other findings No abnormal free fluid. IMPRESSION: 1. Negative for ovarian torsion 2. Bilateral hydrosalpinx with slightly thickened wall and scattered internal echoes which could be secondary to acute inflammation/infection/acute PID. 3. Large 10.6 cm complex cyst posterior to the cervix, associated with left adnexa. Cyst appears more thick walled with less homogeneous internal echoes and is slightly increased in size. Mass remains indeterminate, but could represent tubo-ovarian abscess given inflammatory changes at the adnexa. 4. Large uterine fibroids Electronically Signed   By: Donavan Foil M.D.   On: 01/11/2019 20:10   US Pelvis Complete  Result Date: 01/11/2019 CLINICAL DATA:  Abdominal pain with abnormal CT EXAM: TRANSABDOMINAL AND TRANSVAGINAL ULTRASOUND OF PELVIS DOPPLER ULTRASOUND OF OVARIES TECHNIQUE: Both transabdominal and transvaginal ultrasound examinations of the pelvis were performed. Transabdominal technique was performed for global imaging of the pelvis including uterus, ovaries, adnexal  regions, and pelvic cul-de-sac. It was necessary to proceed with endovaginal exam following the transabdominal exam to visualize the adnexa. Color and duplex Doppler ultrasound was utilized to evaluate blood flow to the ovaries. COMPARISON:  CT 01/11/2019, ultrasound 03/26/2018, CT 03/26/2018 FINDINGS: Uterus Measurements: 11.2 x 5.8 x 6.1 cm = volume: 204.5 mL. Multiple large uterine fibroids. Exophytic fundal fibroid measures 6.8 x 6 x 6.5 cm. Pedunculated fibroid off the lower anterior uterine wall measures 6.2 x 4.5 x 5.7 cm. Endometrium Thickness: 6.3 mm.  No focal abnormality visualized. Right ovary Measurements: 2.7 x 1.8 x 2.2  cm = volume: 5.5 mL. Tortuous tubular structure in the right adnexa, felt consistent with hydrosalpinx. Slightly thickened wall and minimal scattered internal echoes. Left ovary Measurements: 5.4 x 2.5 x 3.6 cm = volume: 25.4 mL. Dilated tubular structure in the left adnexa with slightly thickened wall, felt consistent with hydrosalpinx. Few internal scattered echoes. Posterior to the cervix is a large complex cyst measuring 10.6 x 8.1 x 8.7 cm with flow at the periphery. Cyst contains internal scattered echoes and slightly echogenic debris. Previously this was more homogeneous and measured 8.1 x 8.9 by 7.5 cm. Pulsed Doppler evaluation of both ovaries demonstrates normal low-resistance arterial and venous waveforms. Other findings No abnormal free fluid. IMPRESSION: 1. Negative for ovarian torsion 2. Bilateral hydrosalpinx with slightly thickened wall and scattered internal echoes which could be secondary to acute inflammation/infection/acute PID. 3. Large 10.6 cm complex cyst posterior to the cervix, associated with left adnexa. Cyst appears more thick walled with less homogeneous internal echoes and is slightly increased in size. Mass remains indeterminate, but could represent tubo-ovarian abscess given inflammatory changes at the adnexa. 4. Large uterine fibroids Electronically  Signed   By: Donavan Foil M.D.   On: 01/11/2019 20:10   Ct Abdomen Pelvis W Contrast  Result Date: 01/11/2019 CLINICAL DATA:  Acute onset abdominal pain beginning last night. Suspected appendicitis. EXAM: CT ABDOMEN AND PELVIS WITH CONTRAST TECHNIQUE: Multidetector CT imaging of the abdomen and pelvis was performed using the standard protocol following bolus administration of intravenous contrast. CONTRAST:  137mL OMNIPAQUE IOHEXOL 300 MG/ML SOLN, <See Chart> OMNIPAQUE IOHEXOL 300 MG/ML SOLN COMPARISON:  03/26/2018 FINDINGS: Lower Chest: No acute findings. Hepatobiliary: No hepatic masses identified. Gallbladder is unremarkable. No evidence of biliary ductal dilatation. Pancreas:  No mass or inflammatory changes. Spleen: Within normal limits in size and appearance. Adrenals/Urinary Tract: No masses identified. No evidence of hydronephrosis. Stomach/Bowel: No evidence of obstruction, inflammatory process or abnormal fluid collections. Normal appendix visualized. Vascular/Lymphatic: No pathologically enlarged lymph nodes. No abdominal aortic aneurysm. Reproductive: Several uterine fibroids are again seen. Largest in the anterior lower uterine segment measures 9 cm in maximum diameter. Another fibroid in the fundus measures 6 cm and shows diffuse hypervascular enhancement. Right-sided hydrosalpinx shows mild increase in size, with increased mural enhancement and mild adjacent inflammatory changes, consistent with tubo-ovarian abscess. Left hydrosalpinx shows no significant change. A complex cystic lesion which contains several enhancing internal septations is again seen in the pelvic cul-de-sac, currently measuring 9.5 x 7.7 cm compared to 9.2 x 7.9 cm previously. This likely represents a chronic left tubo-ovarian abscess, with cystic neoplasm of the left ovary considered less likely. No evidence of free fluid. Other:  None. Musculoskeletal:  No suspicious bone lesions identified. IMPRESSION: Mild increase in  size of right hydrosalpinx with mild adjacent inflammatory changes, consistent with acute pelvic inflammatory disease. Stable left hydrosalpinx and 9.5 cm complex cystic lesion in pelvic cul-de-sac, likely representing chronic left tubo-ovarian abscess, with cystic neoplasm of left ovary considered less likely. Multiple uterine fibroids. No evidence of appendicitis. Electronically Signed   By: Marlaine Hind M.D.   On: 01/11/2019 17:22   [place] Art/ven Flow Abd Pelv Doppler  Result Date: 01/11/2019 CLINICAL DATA:  Abdominal pain with abnormal CT EXAM: TRANSABDOMINAL AND TRANSVAGINAL ULTRASOUND OF PELVIS DOPPLER ULTRASOUND OF OVARIES TECHNIQUE: Both transabdominal and transvaginal ultrasound examinations of the pelvis were performed. Transabdominal technique was performed for global imaging of the pelvis including uterus, ovaries, adnexal regions, and pelvic cul-de-sac. It was necessary to proceed with  endovaginal exam following the transabdominal exam to visualize the adnexa. Color and duplex Doppler ultrasound was utilized to evaluate blood flow to the ovaries. COMPARISON:  CT 01/11/2019, ultrasound 03/26/2018, CT 03/26/2018 FINDINGS: Uterus Measurements: 11.2 x 5.8 x 6.1 cm = volume: 204.5 mL. Multiple large uterine fibroids. Exophytic fundal fibroid measures 6.8 x 6 x 6.5 cm. Pedunculated fibroid off the lower anterior uterine wall measures 6.2 x 4.5 x 5.7 cm. Endometrium Thickness: 6.3 mm.  No focal abnormality visualized. Right ovary Measurements: 2.7 x 1.8 x 2.2 cm = volume: 5.5 mL. Tortuous tubular structure in the right adnexa, felt consistent with hydrosalpinx. Slightly thickened wall and minimal scattered internal echoes. Left ovary Measurements: 5.4 x 2.5 x 3.6 cm = volume: 25.4 mL. Dilated tubular structure in the left adnexa with slightly thickened wall, felt consistent with hydrosalpinx. Few internal scattered echoes. Posterior to the cervix is a large complex cyst measuring 10.6 x 8.1 x 8.7 cm with  flow at the periphery. Cyst contains internal scattered echoes and slightly echogenic debris. Previously this was more homogeneous and measured 8.1 x 8.9 by 7.5 cm. Pulsed Doppler evaluation of both ovaries demonstrates normal low-resistance arterial and venous waveforms. Other findings No abnormal free fluid. IMPRESSION: 1. Negative for ovarian torsion 2. Bilateral hydrosalpinx with slightly thickened wall and scattered internal echoes which could be secondary to acute inflammation/infection/acute PID. 3. Large 10.6 cm complex cyst posterior to the cervix, associated with left adnexa. Cyst appears more thick walled with less homogeneous internal echoes and is slightly increased in size. Mass remains indeterminate, but could represent tubo-ovarian abscess given inflammatory changes at the adnexa. 4. Large uterine fibroids Electronically Signed   By: Donavan Foil M.D.   On: 01/11/2019 20:10        Scheduled Meds:  doxycycline  100 mg Oral Q12H   ibuprofen  800 mg Oral TID   pseudoephedrine  120 mg Oral BID   Continuous Infusions:  cefOXitin 2 g (01/13/19 1004)     LOS: 2 days     Georgette Shell, MD [facility]  If 7PM-7AM, please contact night-coverage [email or web address] Password [email or web address] 01/13/2019, 2:57 PM

## 2019-01-13 NOTE — Progress Notes (Signed)
Patient ID: Joanna Smith, female   DOB: 14-Jan-1986, 33 y.o.   MRN: [id number]   Subjective: Patient reports pain a little better. Did not rest well last night and poor appetite URI Sx improved.   Objective: I have reviewed patient's vital signs.  Lungs clear Heart RRR Abd soft + BS slight decrease in tenderness form yesterday   Assessment/Plan: PID +/- TOA, uterine fibroids and bilateral hydrosalpinx Viral URI  WBC decreasing. Remains afebrile. Pain a little better. Just at 24 hrs of antibiotics. Will have IR see pt. Continue with antibiotics. Recheck CBC in AM   LOS: 2 days    Chancy Milroy 01/13/2019, 1:00 PM

## 2019-01-13 NOTE — Consult Note (Signed)
Chief Complaint: Patient was seen in consultation today for tubo-ovarian abscess/drain placement.  Referring Physician(s): Chancy Milroy  Supervising Physician: Sandi Mariscal  Patient Status: [facility] - In-pt  History of Present Illness: Joanna Smith is a 33 y.o. female with a past medical history of IBS. She presented to [facility] ED 01/11/2019 with complaint of abdominal pain. In ED, CT abdomen/pelvis revealed left hydrosalpinx and 9.5 cm complex cystic lesion in pelvic cul-de-sac, consistent with tubo-ovarian abscess. GYN was consulted who recommended IR consultation for possible drain placement.  CT abdomen/pelvis 01/11/2019: 1. Mild increase in size of right hydrosalpinx with mild adjacent inflammatory changes, consistent with acute pelvic inflammatory disease. 2. Stable left hydrosalpinx and 9.5 cm complex cystic lesion in pelvic cul-de-sac, likely representing chronic left tubo-ovarian abscess, with cystic neoplasm of left ovary considered less likely. 3. Multiple uterine fibroids. 4. No evidence of appendicitis.  IR consulted by Dr. Rip Harbour for possible image-guided percutaneous aspiration/drain placement. Patient awake and alert laying in bed. Complains of abdominal pain, rated 9/10 at its worst. Denies fever, chills, chest pain, dyspnea, or headache.   Past Medical History:  Diagnosis Date   IBS (irritable bowel syndrome)     Past Surgical History:  Procedure Laterality Date   HERNIA REPAIR      Allergies: Macadamia nut oil  Medications: Prior to Admission medications   Medication Sig Start Date End Date Taking? Authorizing Provider  aspirin EC 81 MG tablet Take 81 mg by mouth every 6 (six) hours as needed for mild pain.    Yes [provider]  ibuprofen (ADVIL) 600 MG tablet Take 1 tablet (600 mg total) by mouth every 6 (six) hours as needed. Patient taking differently: Take 600 mg by mouth every 6 (six) hours as needed for moderate pain.  11/25/18  Yes  Melynda Ripple, MD     Family History  Problem Relation Age of Onset   Diabetes Other     Social History   Socioeconomic History   Marital status: Significant Other    Spouse name: Not on file   Number of children: Not on file   Years of education: Not on file   Highest education level: Not on file  Occupational History   Not on file  Social Needs   Financial resource strain: Not on file   Food insecurity    Worry: Not on file    Inability: Not on file   Transportation needs    Medical: Not on file    Non-medical: Not on file  Tobacco Use   Smoking status: Former Smoker   Smokeless tobacco: Never Used  Substance and Sexual Activity   Alcohol use: Never    Frequency: Never   Drug use: Yes    Types: Marijuana   Sexual activity: Yes    Birth control/protection: None  Lifestyle   Physical activity    Days per week: Not on file    Minutes per session: Not on file   Stress: Not on file  Relationships   Social connections    Talks on phone: Not on file    Gets together: Not on file    Attends religious service: Not on file    Active member of club or organization: Not on file    Attends meetings of clubs or organizations: Not on file    Relationship status: Not on file  Other Topics Concern   Not on file  Social History Narrative   ** Merged History Encounter **  Review of Systems: A 12 point ROS discussed and pertinent positives are indicated in the HPI above.  All other systems are negative.  Review of Systems  Constitutional: Negative for chills and fever.  Respiratory: Negative for shortness of breath and wheezing.   Cardiovascular: Negative for chest pain and palpitations.  Gastrointestinal: Positive for abdominal pain.  Neurological: Negative for headaches.  Psychiatric/Behavioral: Negative for behavioral problems and confusion.    Vital Signs: BP 127/78 (BP Location: Right Arm)    Pulse 88    Temp 99 F (37.2 C) (Oral)     Resp 18    Ht 5\' 2"  (1.575 m)    Wt 160 lb (72.6 kg)    LMP 01/04/2019 (Approximate)    SpO2 99%    BMI 29.26 kg/m   Physical Exam Vitals signs and nursing note reviewed.  Constitutional:      General: She is not in acute distress.    Appearance: Normal appearance.  Cardiovascular:     Rate and Rhythm: Normal rate and regular rhythm.     Heart sounds: Normal heart sounds. No murmur.  Pulmonary:     Effort: Pulmonary effort is normal. No respiratory distress.     Breath sounds: Normal breath sounds. No wheezing.  Abdominal:     General: There is no distension.     Tenderness: There is abdominal tenderness. There is no guarding.  Skin:    General: Skin is warm and dry.  Neurological:     Mental Status: She is alert and oriented to person, place, and time.  Psychiatric:        Mood and Affect: Mood normal.        Behavior: Behavior normal.        Thought Content: Thought content normal.        Judgment: Judgment normal.      MD Evaluation Airway: WNL Heart: WNL Abdomen: WNL Chest/ Lungs: WNL ASA  Classification: 2 Mallampati/Airway Score: Two   Imaging: US Transvaginal Non-ob  Result Date: 01/11/2019 CLINICAL DATA:  Abdominal pain with abnormal CT EXAM: TRANSABDOMINAL AND TRANSVAGINAL ULTRASOUND OF PELVIS DOPPLER ULTRASOUND OF OVARIES TECHNIQUE: Both transabdominal and transvaginal ultrasound examinations of the pelvis were performed. Transabdominal technique was performed for global imaging of the pelvis including uterus, ovaries, adnexal regions, and pelvic cul-de-sac. It was necessary to proceed with endovaginal exam following the transabdominal exam to visualize the adnexa. Color and duplex Doppler ultrasound was utilized to evaluate blood flow to the ovaries. COMPARISON:  CT 01/11/2019, ultrasound 03/26/2018, CT 03/26/2018 FINDINGS: Uterus Measurements: 11.2 x 5.8 x 6.1 cm = volume: 204.5 mL. Multiple large uterine fibroids. Exophytic fundal fibroid measures 6.8 x 6 x  6.5 cm. Pedunculated fibroid off the lower anterior uterine wall measures 6.2 x 4.5 x 5.7 cm. Endometrium Thickness: 6.3 mm.  No focal abnormality visualized. Right ovary Measurements: 2.7 x 1.8 x 2.2 cm = volume: 5.5 mL. Tortuous tubular structure in the right adnexa, felt consistent with hydrosalpinx. Slightly thickened wall and minimal scattered internal echoes. Left ovary Measurements: 5.4 x 2.5 x 3.6 cm = volume: 25.4 mL. Dilated tubular structure in the left adnexa with slightly thickened wall, felt consistent with hydrosalpinx. Few internal scattered echoes. Posterior to the cervix is a large complex cyst measuring 10.6 x 8.1 x 8.7 cm with flow at the periphery. Cyst contains internal scattered echoes and slightly echogenic debris. Previously this was more homogeneous and measured 8.1 x 8.9 by 7.5 cm. Pulsed Doppler evaluation of both ovaries demonstrates  normal low-resistance arterial and venous waveforms. Other findings No abnormal free fluid. IMPRESSION: 1. Negative for ovarian torsion 2. Bilateral hydrosalpinx with slightly thickened wall and scattered internal echoes which could be secondary to acute inflammation/infection/acute PID. 3. Large 10.6 cm complex cyst posterior to the cervix, associated with left adnexa. Cyst appears more thick walled with less homogeneous internal echoes and is slightly increased in size. Mass remains indeterminate, but could represent tubo-ovarian abscess given inflammatory changes at the adnexa. 4. Large uterine fibroids Electronically Signed   By: Donavan Foil M.D.   On: 01/11/2019 20:10   US Pelvis Complete  Result Date: 01/11/2019 CLINICAL DATA:  Abdominal pain with abnormal CT EXAM: TRANSABDOMINAL AND TRANSVAGINAL ULTRASOUND OF PELVIS DOPPLER ULTRASOUND OF OVARIES TECHNIQUE: Both transabdominal and transvaginal ultrasound examinations of the pelvis were performed. Transabdominal technique was performed for global imaging of the pelvis including uterus, ovaries,  adnexal regions, and pelvic cul-de-sac. It was necessary to proceed with endovaginal exam following the transabdominal exam to visualize the adnexa. Color and duplex Doppler ultrasound was utilized to evaluate blood flow to the ovaries. COMPARISON:  CT 01/11/2019, ultrasound 03/26/2018, CT 03/26/2018 FINDINGS: Uterus Measurements: 11.2 x 5.8 x 6.1 cm = volume: 204.5 mL. Multiple large uterine fibroids. Exophytic fundal fibroid measures 6.8 x 6 x 6.5 cm. Pedunculated fibroid off the lower anterior uterine wall measures 6.2 x 4.5 x 5.7 cm. Endometrium Thickness: 6.3 mm.  No focal abnormality visualized. Right ovary Measurements: 2.7 x 1.8 x 2.2 cm = volume: 5.5 mL. Tortuous tubular structure in the right adnexa, felt consistent with hydrosalpinx. Slightly thickened wall and minimal scattered internal echoes. Left ovary Measurements: 5.4 x 2.5 x 3.6 cm = volume: 25.4 mL. Dilated tubular structure in the left adnexa with slightly thickened wall, felt consistent with hydrosalpinx. Few internal scattered echoes. Posterior to the cervix is a large complex cyst measuring 10.6 x 8.1 x 8.7 cm with flow at the periphery. Cyst contains internal scattered echoes and slightly echogenic debris. Previously this was more homogeneous and measured 8.1 x 8.9 by 7.5 cm. Pulsed Doppler evaluation of both ovaries demonstrates normal low-resistance arterial and venous waveforms. Other findings No abnormal free fluid. IMPRESSION: 1. Negative for ovarian torsion 2. Bilateral hydrosalpinx with slightly thickened wall and scattered internal echoes which could be secondary to acute inflammation/infection/acute PID. 3. Large 10.6 cm complex cyst posterior to the cervix, associated with left adnexa. Cyst appears more thick walled with less homogeneous internal echoes and is slightly increased in size. Mass remains indeterminate, but could represent tubo-ovarian abscess given inflammatory changes at the adnexa. 4. Large uterine fibroids  Electronically Signed   By: Donavan Foil M.D.   On: 01/11/2019 20:10   Ct Abdomen Pelvis W Contrast  Result Date: 01/11/2019 CLINICAL DATA:  Acute onset abdominal pain beginning last night. Suspected appendicitis. EXAM: CT ABDOMEN AND PELVIS WITH CONTRAST TECHNIQUE: Multidetector CT imaging of the abdomen and pelvis was performed using the standard protocol following bolus administration of intravenous contrast. CONTRAST:  161mL OMNIPAQUE IOHEXOL 300 MG/ML SOLN, <See Chart> OMNIPAQUE IOHEXOL 300 MG/ML SOLN COMPARISON:  03/26/2018 FINDINGS: Lower Chest: No acute findings. Hepatobiliary: No hepatic masses identified. Gallbladder is unremarkable. No evidence of biliary ductal dilatation. Pancreas:  No mass or inflammatory changes. Spleen: Within normal limits in size and appearance. Adrenals/Urinary Tract: No masses identified. No evidence of hydronephrosis. Stomach/Bowel: No evidence of obstruction, inflammatory process or abnormal fluid collections. Normal appendix visualized. Vascular/Lymphatic: No pathologically enlarged lymph nodes. No abdominal aortic aneurysm. Reproductive: Several  uterine fibroids are again seen. Largest in the anterior lower uterine segment measures 9 cm in maximum diameter. Another fibroid in the fundus measures 6 cm and shows diffuse hypervascular enhancement. Right-sided hydrosalpinx shows mild increase in size, with increased mural enhancement and mild adjacent inflammatory changes, consistent with tubo-ovarian abscess. Left hydrosalpinx shows no significant change. A complex cystic lesion which contains several enhancing internal septations is again seen in the pelvic cul-de-sac, currently measuring 9.5 x 7.7 cm compared to 9.2 x 7.9 cm previously. This likely represents a chronic left tubo-ovarian abscess, with cystic neoplasm of the left ovary considered less likely. No evidence of free fluid. Other:  None. Musculoskeletal:  No suspicious bone lesions identified. IMPRESSION: Mild  increase in size of right hydrosalpinx with mild adjacent inflammatory changes, consistent with acute pelvic inflammatory disease. Stable left hydrosalpinx and 9.5 cm complex cystic lesion in pelvic cul-de-sac, likely representing chronic left tubo-ovarian abscess, with cystic neoplasm of left ovary considered less likely. Multiple uterine fibroids. No evidence of appendicitis. Electronically Signed   By: Marlaine Hind M.D.   On: 01/11/2019 17:22   [place] Art/ven Flow Abd Pelv Doppler  Result Date: 01/11/2019 CLINICAL DATA:  Abdominal pain with abnormal CT EXAM: TRANSABDOMINAL AND TRANSVAGINAL ULTRASOUND OF PELVIS DOPPLER ULTRASOUND OF OVARIES TECHNIQUE: Both transabdominal and transvaginal ultrasound examinations of the pelvis were performed. Transabdominal technique was performed for global imaging of the pelvis including uterus, ovaries, adnexal regions, and pelvic cul-de-sac. It was necessary to proceed with endovaginal exam following the transabdominal exam to visualize the adnexa. Color and duplex Doppler ultrasound was utilized to evaluate blood flow to the ovaries. COMPARISON:  CT 01/11/2019, ultrasound 03/26/2018, CT 03/26/2018 FINDINGS: Uterus Measurements: 11.2 x 5.8 x 6.1 cm = volume: 204.5 mL. Multiple large uterine fibroids. Exophytic fundal fibroid measures 6.8 x 6 x 6.5 cm. Pedunculated fibroid off the lower anterior uterine wall measures 6.2 x 4.5 x 5.7 cm. Endometrium Thickness: 6.3 mm.  No focal abnormality visualized. Right ovary Measurements: 2.7 x 1.8 x 2.2 cm = volume: 5.5 mL. Tortuous tubular structure in the right adnexa, felt consistent with hydrosalpinx. Slightly thickened wall and minimal scattered internal echoes. Left ovary Measurements: 5.4 x 2.5 x 3.6 cm = volume: 25.4 mL. Dilated tubular structure in the left adnexa with slightly thickened wall, felt consistent with hydrosalpinx. Few internal scattered echoes. Posterior to the cervix is a large complex cyst measuring 10.6 x 8.1 x 8.7  cm with flow at the periphery. Cyst contains internal scattered echoes and slightly echogenic debris. Previously this was more homogeneous and measured 8.1 x 8.9 by 7.5 cm. Pulsed Doppler evaluation of both ovaries demonstrates normal low-resistance arterial and venous waveforms. Other findings No abnormal free fluid. IMPRESSION: 1. Negative for ovarian torsion 2. Bilateral hydrosalpinx with slightly thickened wall and scattered internal echoes which could be secondary to acute inflammation/infection/acute PID. 3. Large 10.6 cm complex cyst posterior to the cervix, associated with left adnexa. Cyst appears more thick walled with less homogeneous internal echoes and is slightly increased in size. Mass remains indeterminate, but could represent tubo-ovarian abscess given inflammatory changes at the adnexa. 4. Large uterine fibroids Electronically Signed   By: Donavan Foil M.D.   On: 01/11/2019 20:10   Dg Chest Port 1 View  Result Date: 01/11/2019 CLINICAL DATA:  Tachycardia with cough and fevers EXAM: PORTABLE CHEST 1 VIEW COMPARISON:  None. FINDINGS: Lungs are clear. Heart size and pulmonary vascularity are normal. No adenopathy. No pneumothorax. No bone lesions. IMPRESSION: No edema  or consolidation. Electronically Signed   By: Lowella Grip III M.D.   On: 01/11/2019 13:41    Labs:  CBC: Recent Labs    03/26/18 1651 01/11/19 1324 01/12/19 0231 01/13/19 0458  WBC 14.7* 23.9* 22.3* 18.9*  HGB 12.8 13.5 12.6 11.6*  HCT 40.4 42.0 39.4 36.7  PLT 280 261 268 247    COAGS: No results for input(s): INR, APTT in the last 8760 hours.  BMP: Recent Labs    03/26/18 1651 01/11/19 1324 01/12/19 0231 01/13/19 0458  NA 136 136 137 136  K 3.7 3.8 3.7 3.1*  CL 105 104 102 103  CO2 27 22 22 24   GLUCOSE 94 101* 73 87  BUN <5* 7 5* <5*  CALCIUM 8.8* 8.6* 8.2* 7.8*  CREATININE 1.00 0.73 0.92 0.88  GFRNONAA >60 >60 >60 >60  GFRAA >60 >60 >60 >60    LIVER FUNCTION TESTS: Recent Labs     03/26/18 1651 01/11/19 1324 01/12/19 0231 01/13/19 0458  BILITOT 1.1 0.7 0.7 1.0  AST 16 13* 13* 11*  ALT 11 11 6 10   ALKPHOS 59 72 87 67  PROT 7.0 7.1 6.5 6.3*  ALBUMIN 3.8 3.5 3.2* 2.8*     Assessment and Plan:  Tubo-ovarian abscess. Plan for image-guided percutaneous aspiration/drain placement tentatively for tomorrow 01/14/2019 in IR. Patient will be NPO at midnight. Afebrile. She does not take blood thinners. INR pending for 0500 tomorrow.  Risks and benefits discussed with the patient including bleeding, infection, damage to adjacent structures, bowel perforation/fistula connection, and sepsis. All of the patient's questions were answered, patient is agreeable to proceed. Consent signed and in chart.   Thank you for this interesting consult.  I greatly enjoyed meeting Joanna Smith and look forward to participating in their care.  A copy of this report was sent to the requesting provider on this date.  Electronically Signed: Earley Abide, PA-C 01/13/2019, 3:43 PM   I spent a total of 40 Minutes in face to face in clinical consultation, greater than 50% of which was counseling/coordinating care for tubo-ovarian abscess/drain placement.

## 2019-01-13 NOTE — Progress Notes (Signed)
Pt had a moderate amount of clearish-Brownish discharge that just appeared for the first time and she was worried. RN notified Dr. Zigmund Daniel via secure chat awaiting page back.

## 2019-01-14 ENCOUNTER — Inpatient Hospital Stay (HOSPITAL_COMMUNITY): Payer: Self-pay

## 2019-01-14 DIAGNOSIS — A419 Sepsis, unspecified organism: Principal | ICD-10-CM

## 2019-01-14 LAB — BASIC METABOLIC PANEL
Anion gap: 11 (ref 5–15)
BUN: <5 mg/dL — ABNORMAL LOW (ref 6–20)
CO2: 22 mmol/L (ref 22–32)
Calcium: 8.2 mg/dL — ABNORMAL LOW (ref 8.9–10.3)
Chloride: 104 mmol/L (ref 98–111)
Creatinine, Ser: 0.86 mg/dL (ref 0.44–1.00)
GFR calc Af Amer: >60 mL/min (ref >60)
GFR calc non Af Amer: >60 mL/min (ref >60)
Glucose, Bld: 94 mg/dL (ref 70–99)
Potassium: 3.9 mmol/L (ref 3.5–5.1)
Sodium: 137 mmol/L (ref 135–145)

## 2019-01-14 LAB — CBC WITH DIFFERENTIAL/PLATELET
Abs Immature Granulocytes: 0.08 10*3/uL — ABNORMAL HIGH (ref 0.00–0.07)
Basophils Absolute: 0 10*3/uL (ref 0.0–0.1)
Basophils Relative: 0 %
Eosinophils Absolute: 0 10*3/uL (ref 0.0–0.5)
Eosinophils Relative: 0 %
HCT: 39.9 % (ref 36.0–46.0)
Hemoglobin: 13 g/dL (ref 12.0–15.0)
Immature Granulocytes: 1 %
Lymphocytes Relative: 11 %
Lymphs Abs: 1.4 10*3/uL (ref 0.7–4.0)
MCH: 27.7 pg (ref 26.0–34.0)
MCHC: 32.6 g/dL (ref 30.0–36.0)
MCV: 85.1 fL (ref 80.0–100.0)
Monocytes Absolute: 1.2 10*3/uL — ABNORMAL HIGH (ref 0.1–1.0)
Monocytes Relative: 10 %
Neutro Abs: 9.4 10*3/uL — ABNORMAL HIGH (ref 1.7–7.7)
Neutrophils Relative %: 78 %
Platelets: 250 10*3/uL (ref 150–400)
RBC: 4.69 MIL/uL (ref 3.87–5.11)
RDW: 13.8 % (ref 11.5–15.5)
WBC: 12.1 10*3/uL — ABNORMAL HIGH (ref 4.0–10.5)
nRBC: 0 % (ref 0.0–0.2)

## 2019-01-14 LAB — PROTIME-INR
INR: 1.1 (ref 0.8–1.2)
Prothrombin Time: 13.7 seconds (ref 11.4–15.2)

## 2019-01-14 LAB — MAGNESIUM: Magnesium: 2 mg/dL (ref 1.7–2.4)

## 2019-01-14 MED ORDER — PROCHLORPERAZINE EDISYLATE 10 MG/2ML IJ SOLN
5.0000 mg | Freq: Once | INTRAMUSCULAR | Status: AC
  Administered 2019-01-14: 5 mg via INTRAVENOUS
  Filled 2019-01-14: qty 2

## 2019-01-14 MED ORDER — FENTANYL CITRATE (PF) 100 MCG/2ML IJ SOLN
INTRAMUSCULAR | Status: AC | PRN
  Administered 2019-01-14 (×2): 50 ug via INTRAVENOUS

## 2019-01-14 MED ORDER — MIDAZOLAM HCL 2 MG/2ML IJ SOLN
INTRAMUSCULAR | Status: AC | PRN
  Administered 2019-01-14 (×2): 1 mg via INTRAVENOUS

## 2019-01-14 MED ORDER — ONDANSETRON HCL 4 MG/2ML IJ SOLN
INTRAMUSCULAR | Status: AC | PRN
  Administered 2019-01-14: 4 mg via INTRAVENOUS

## 2019-01-14 MED ORDER — SODIUM CHLORIDE 0.9% FLUSH
5.0000 mL | Freq: Three times a day (TID) | INTRAVENOUS | Status: DC
  Administered 2019-01-14 – 2019-01-15 (×2): 5 mL

## 2019-01-14 MED ORDER — LIDOCAINE HCL 1 % IJ SOLN
INTRAMUSCULAR | Status: AC
  Filled 2019-01-14: qty 20

## 2019-01-14 MED ORDER — MIDAZOLAM HCL 2 MG/2ML IJ SOLN
INTRAMUSCULAR | Status: AC
  Filled 2019-01-14: qty 2

## 2019-01-14 MED ORDER — ONDANSETRON HCL 4 MG/2ML IJ SOLN
INTRAMUSCULAR | Status: AC
  Filled 2019-01-14: qty 2

## 2019-01-14 MED ORDER — FENTANYL CITRATE (PF) 100 MCG/2ML IJ SOLN
INTRAMUSCULAR | Status: AC
  Filled 2019-01-14: qty 2

## 2019-01-14 NOTE — Progress Notes (Signed)
Pt remains nauseous . Pt states she is constantly sweating in room, pt given fan and oral temperature rechecked (see vitals). Pt states every time she gets medication she feels like she needs to throw up. Pt dry heaving in bathroom, provider paged for possibly another antiemetic medication and to be made aware.

## 2019-01-14 NOTE — Progress Notes (Signed)
PROGRESS NOTE    Joanna Smith  [id number] DOB: 1985-04-28 DOA: 01/11/2019 PCP: Patient, No Pcp Per   Brief Narrative: As per HPI 33 y.o.femalewith medical history significant Of PID, TOA presented withabdominal pain congestion no nausea vomiting no diarrhea has had significant cough for past 4 days PTA occasionally productive of sputum she had initially cold-like symptoms last week but now settled into her chest. She has occasional chest pain with coughing. No leg swelling no hemoptysis no personal history of PE or DVT. Patient denied any sick contacts she try to use Zyrtec but did not seem to help. Having fevers last night and feels like her abdomen right lower quadrant pain feels similar to when she had fluid in her fallopian tubes in the past no vaginal discharge Patient initially stated to emergency department that [facility] virus testing has already virus on them and that she felt"set up because she is a black woman"for being tested for coronavirus She eventually agreed to get tested While in ER: CT abdomen showed evidence of PID as well as left hydrosalpinx and 9.5 cm complex cystic lesion of pelvic cul-de-sac likely secondary to chronic left tubo-ovarian abscess OB/GYN was consulted requesting medicine to admit given respiratory symptoms although patient is COVID negative Later on respiratory panel came back with rhinovirus which is likely explaining patient's respiratory symptoms  Subjective: Feeling much better this morning.  Still has pain on right lower abdomen Leukocytosis has improved.  Overnight tmax 100.1  Assessment & Plan:  Acute PID with TVS ultrasound showing bilateral hydrosalpinx considered PID and large complex cyst posterior to uterus possible tubo-ovarian abscess.  Appreciate OB/GYN consult on board, patient is being planned for IR drainage placement.  Continue on IV doxycycline and cefoxitin antibiotics as per GYN.  Leukocytosis improved overall improving.   Sepsis without acute organ dysfunction: Leukocytosis improving continue antibiotics as above.  She is hemodynamically stable.  Upper respiratory illness with positive for rhinovirus: Supportive care.  Multiple uterine fibroids.  Polysubstance abuse counseled against cessation  Hypomagnesemia/Hypokalemia -resolved.  Gastritis DC Advil. conmt  PPI  Constipation -cont  Colace  DVT prophylaxis: SCD Code Status: Full code Family Communication: plan of care discussed with patient in detail. Will update family Disposition Plan: Remains inpatient pending clinical improvement.   Consultants: GYN, IR  Procedures: For drains placement 10/15  TVUS  1. Negative for ovarian torsion 2. Bilateral hydrosalpinx with slightly thickened wall and scattered internal echoes which could be secondary to acute inflammation/infection/acute PID. 3. Large 10.6 cm complex cyst posterior to the cervix, associated with left adnexa. Cyst appears more thick walled with less homogeneous internal echoes and is slightly increased in size. Mass remains indeterminate, but could represent tubo-ovarian abscess given inflammatory changes at the adnexa. 4. Large uterine fibroids  CT abdomen IMPRESSION: Mild increase in size of right hydrosalpinx with mild adjacent inflammatory changes, consistent with acute pelvic inflammatory disease.  Stable left hydrosalpinx and 9.5 cm complex cystic lesion in pelvic cul-de-sac, likely representing chronic left tubo-ovarian abscess, with cystic neoplasm of left ovary considered less likely.  Multiple uterine fibroids.  No evidence of appendicitis.  Microbiology: [id number] negative. Respiratory panel positive for rhinovirus/enterovirus  Antimicrobials: Anti-infectives (From admission, onward)   Start     Dose/Rate Route Frequency Ordered Stop   01/12/19 0600  doxycycline (VIBRA-TABS) tablet 100 mg     100 mg Oral Every 12 hours 01/12/19 0028     01/12/19  0200  cefOXitin (MEFOXIN) 2 g in sodium chloride 0.9 % 100 mL  IVPB     2 g 200 mL/hr over 30 Minutes Intravenous Every 6 hours 01/12/19 0028     01/11/19 1900  cefOXitin (MEFOXIN) 2 g in sodium chloride 0.9 % 100 mL IVPB     2 g 200 mL/hr over 30 Minutes Intravenous  Once 01/11/19 1824 01/11/19 2040   01/11/19 1830  doxycycline (VIBRA-TABS) tablet 100 mg     100 mg Oral  Once 01/11/19 1824 01/11/19 1857       Objective: Vitals:   01/14/19 1415 01/14/19 1420 01/14/19 1425 01/14/19 1430  BP: 131/90 129/86 135/87 (!) 138/94  Pulse: 78 81 80 90  Resp: 17 15 20 16   Temp:      TempSrc:      SpO2: 99% 98% 96% 95%  Weight:      Height:        Intake/Output Summary (Last 24 hours) at 01/14/2019 1433 Last data filed at 01/14/2019 1430 Gross per 24 hour  Intake 369.72 ml  Output 260 ml  Net 109.72 ml   Filed Weights   01/11/19 1143  Weight: 72.6 kg   Weight change:   Body mass index is 29.26 kg/m.  Intake/Output from previous day: 10/14 0701 - 10/15 0700 In: 359.7 [IV Piggyback:359.7] Out: -  Intake/Output this shift: Total I/O In: 10 [Other:10] Out: 260 [Drains:260]  Examination:  General exam: Appears calm and comfortable, thin, older fore the age HEENT:PERRL,Oral mucosa moist, Ear/Nose normal on gross exam Respiratory system: Bilateral equal air entry, normal vesicular breath sounds, no wheezes or crackles  Cardiovascular system: S1 & S2 heard,No JVD, murmurs. Gastrointestinal system: Abdomen is  soft, tenderness+ on the right lower quadrant ,non distended, BS +  Nervous System:Alert and oriented. No focal neurological deficits/moving extremities, sensation intact. Extremities: No edema, no clubbing, distal peripheral pulses palpable. Skin: No rashes, lesions, no icterus MSK: Normal muscle bulk,tone ,power  Medications:  Scheduled Meds: . docusate sodium  200 mg Oral Daily  . doxycycline  100 mg Oral Q12H  . fentaNYL      . lidocaine      . midazolam       . ondansetron      . pantoprazole  40 mg Oral Daily  . pseudoephedrine  120 mg Oral BID   Continuous Infusions: . cefOXitin 2 g (01/14/19 1004)    Data Reviewed: I have personally reviewed following labs and imaging studies  CBC: Recent Labs  Lab 01/11/19 1324 01/12/19 0231 01/13/19 0458 01/14/19 0729  WBC 23.9* 22.3* 18.9* 12.1*  NEUTROABS 18.7*  --  13.9* 9.4*  HGB 13.5 12.6 11.6* 13.0  HCT 42.0 39.4 36.7 39.9  MCV 86.1 87.0 85.7 85.1  PLT 261 268 247 [id number]   Basic Metabolic Panel: Recent Labs  Lab 01/11/19 1324 01/12/19 0231 01/13/19 0458 01/14/19 0729  NA 136 137 136 137  K 3.8 3.7 3.1* 3.9  CL 104 102 103 104  CO2 22 22 24 22   GLUCOSE 101* 73 87 94  BUN 7 5* <5* <5*  CREATININE 0.73 0.92 0.88 0.86  CALCIUM 8.6* 8.2* 7.8* 8.2*  MG  --  1.5* 1.6* 2.0  PHOS  --  3.5  --   --    GFR: Estimated Creatinine Clearance: 87.6 mL/min (by C-G formula based on SCr of 0.86 mg/dL). Liver Function Tests: Recent Labs  Lab 01/11/19 1324 01/12/19 0231 01/13/19 0458  AST 13* 13* 11*  ALT 11 6 10   ALKPHOS 72 87 67  BILITOT 0.7 0.7 1.0  PROT 7.1 6.5 6.3*  ALBUMIN 3.5 3.2* 2.8*   Recent Labs  Lab 01/11/19 1324  LIPASE 24   No results for input(s): AMMONIA in the last 168 hours. Coagulation Profile: Recent Labs  Lab 01/14/19 0729  INR 1.1   Cardiac Enzymes: No results for input(s): CKTOTAL, CKMB, CKMBINDEX, TROPONINI in the last 168 hours. BNP (last 3 results) No results for input(s): PROBNP in the last 8760 hours. HbA1C: No results for input(s): HGBA1C in the last 72 hours. CBG: No results for input(s): GLUCAP in the last 168 hours. Lipid Profile: No results for input(s): CHOL, HDL, LDLCALC, TRIG, CHOLHDL, LDLDIRECT in the last 72 hours. Thyroid Function Tests: Recent Labs    01/12/19 0231  TSH 1.007   Anemia Panel: No results for input(s): VITAMINB12, FOLATE, FERRITIN, TIBC, IRON, RETICCTPCT in the last 72 hours. Sepsis Labs: Recent Labs  Lab  01/11/19 1324  LATICACIDVEN 0.7    Recent Results (from the past 240 hour(s))  SARS CORONAVIRUS 2 (TAT 6-24 HRS) Nasopharyngeal Nasopharyngeal Swab     Status: None   Collection Time: 01/11/19  1:24 PM   Specimen: Nasopharyngeal Swab  Result Value Ref Range Status   SARS Coronavirus 2 NEGATIVE NEGATIVE Final    Comment: (NOTE) SARS-CoV-2 target nucleic acids are NOT DETECTED. The SARS-CoV-2 RNA is generally detectable in upper and lower respiratory specimens during the acute phase of infection. Negative results do not preclude SARS-CoV-2 infection, do not rule out co-infections with other pathogens, and should not be used as the sole basis for treatment or other patient management decisions. Negative results must be combined with clinical observations, patient history, and epidemiological information. The expected result is Negative. Fact Sheet for Patients: SugarRoll.be Fact Sheet for Healthcare Providers: https://www.woods-mathews.com/ This test is not yet approved or cleared by the [place] FDA and  has been authorized for detection and/or diagnosis of SARS-CoV-2 by FDA under an Emergency Use Authorization (EUA). This EUA will remain  in effect (meaning this test can be used) for the duration of the COVID-19 declaration under Section 56 4(b)(1) of the Act, 21 U.S.C. section 360bbb-3(b)(1), unless the authorization is terminated or revoked sooner. Performed at [facility], [place] [address], [place], [place] [address]   Wet prep, genital     Status: Abnormal   Collection Time: 01/11/19  3:18 PM   Specimen: PATH Cytology Cervicovaginal Ancillary Only  Result Value Ref Range Status   Yeast Wet Prep HPF POC NONE SEEN NONE SEEN Final   Trich, Wet Prep NONE SEEN NONE SEEN Final   Clue Cells Wet Prep HPF POC NONE SEEN NONE SEEN Final   WBC, Wet Prep HPF POC FEW (A) NONE SEEN Final    Comment: Specimen diluted due to transport  tube containing more than 1 ml of saline, interpret results with caution.   Sperm NONE SEEN  Final    Comment: Performed at [facility], [place] [address], [place], [place] [address]  Culture, blood (routine x 2)     Status: None (Preliminary result)   Collection Time: 01/11/19  6:10 PM   Specimen: BLOOD  Result Value Ref Range Status   Specimen Description BLOOD RIGHT ANTECUBITAL  Final   Special Requests   Final    BOTTLES DRAWN AEROBIC AND ANAEROBIC Blood Culture adequate volume   Culture   Final    NO GROWTH 3 DAYS Performed at [facility], [place] [address] [place], [place] [address]    Report  Status PENDING  Incomplete  Culture, blood (routine x 2)     Status: None (Preliminary result)   Collection Time: 01/11/19  6:15 PM   Specimen: BLOOD  Result Value Ref Range Status   Specimen Description BLOOD LEFT ANTECUBITAL  Final   Special Requests   Final    BOTTLES DRAWN AEROBIC AND ANAEROBIC Blood Culture results may not be optimal due to an excessive volume of blood received in culture bottles   Culture   Final    NO GROWTH 3 DAYS Performed at [facility], [place] [address], [place], [place] [address]    Report Status PENDING  Incomplete  Respiratory Panel by PCR     Status: Abnormal   Collection Time: 01/11/19  7:02 PM   Specimen: Nasopharyngeal Swab; Respiratory  Result Value Ref Range Status   Adenovirus NOT DETECTED NOT DETECTED Final   Coronavirus 229E NOT DETECTED NOT DETECTED Final    Comment: (NOTE) The Coronavirus on the Respiratory Panel, DOES NOT test for the novel  Coronavirus (2019 nCoV)    Coronavirus HKU1 NOT DETECTED NOT DETECTED Final   Coronavirus NL63 NOT DETECTED NOT DETECTED Final   Coronavirus OC43 NOT DETECTED NOT DETECTED Final   Metapneumovirus NOT DETECTED NOT DETECTED Final   Rhinovirus / Enterovirus DETECTED (A) NOT DETECTED Final   Influenza A NOT DETECTED NOT DETECTED Final   Influenza B NOT DETECTED NOT DETECTED Final    Parainfluenza Virus 1 NOT DETECTED NOT DETECTED Final   Parainfluenza Virus 2 NOT DETECTED NOT DETECTED Final   Parainfluenza Virus 3 NOT DETECTED NOT DETECTED Final   Parainfluenza Virus 4 NOT DETECTED NOT DETECTED Final   Respiratory Syncytial Virus NOT DETECTED NOT DETECTED Final   Bordetella pertussis NOT DETECTED NOT DETECTED Final   Chlamydophila pneumoniae NOT DETECTED NOT DETECTED Final   Mycoplasma pneumoniae NOT DETECTED NOT DETECTED Final    Comment: Performed at [facility], [place]. [address], [place], [place] [address]      Radiology Studies: No results found.    LOS: 3 days   Time spent: More than 50% of that time was spent in counseling and/or coordination of care.  Antonieta Pert, MD [facility]  01/14/2019, 2:33 PM

## 2019-01-14 NOTE — Procedures (Signed)
PID, pelvic abscess  S/p CT transgluteal abscess drain insertion  No comp Stable ebl min 260cc pus aspirated cx sent Full report in pacs

## 2019-01-14 NOTE — Progress Notes (Signed)
Subjective: Patient reports feeling better today. Pain improved. Some nausea and appetite slowly improving  Objective: I have reviewed patient's vital signs, medications and labs.  Lungs clear Heart RRR Abd soft + BS drain noted tenderness RLQ improved, mass affect remains unchanged Ext nontender   Assessment/Plan: PID/ TOA uterine fibroids and bilateral hydrosalpinx     S/P placement of percutaneous drain by IR today     WBC down, continues to improve, continue IV antibiotics     Most likely will be able to discharge home from a GYN standpoint tomorrow    with 14 days total of antibiotics and f/u with IR for drain removal and in our office in 2-4 weeks.  Viral URI, per IM     LOS: 3 days    Chancy Milroy 01/14/2019, 3:52 PM

## 2019-01-15 LAB — CBC
HCT: 39.3 % (ref 36.0–46.0)
Hemoglobin: 12.5 g/dL (ref 12.0–15.0)
MCH: 26.9 pg (ref 26.0–34.0)
MCHC: 31.8 g/dL (ref 30.0–36.0)
MCV: 84.5 fL (ref 80.0–100.0)
Platelets: 295 10*3/uL (ref 150–400)
RBC: 4.65 MIL/uL (ref 3.87–5.11)
RDW: 13.7 % (ref 11.5–15.5)
WBC: 10.8 10*3/uL — ABNORMAL HIGH (ref 4.0–10.5)
nRBC: 0 % (ref 0.0–0.2)

## 2019-01-15 LAB — COMPREHENSIVE METABOLIC PANEL
ALT: 8 U/L (ref 0–44)
AST: 12 U/L — ABNORMAL LOW (ref 15–41)
Albumin: 2.9 g/dL — ABNORMAL LOW (ref 3.5–5.0)
Alkaline Phosphatase: 67 U/L (ref 38–126)
Anion gap: 9 (ref 5–15)
BUN: <5 mg/dL — ABNORMAL LOW (ref 6–20)
CO2: 23 mmol/L (ref 22–32)
Calcium: 8.3 mg/dL — ABNORMAL LOW (ref 8.9–10.3)
Chloride: 105 mmol/L (ref 98–111)
Creatinine, Ser: 0.86 mg/dL (ref 0.44–1.00)
GFR calc Af Amer: >60 mL/min (ref >60)
GFR calc non Af Amer: >60 mL/min (ref >60)
Glucose, Bld: 94 mg/dL (ref 70–99)
Potassium: 3.6 mmol/L (ref 3.5–5.1)
Sodium: 137 mmol/L (ref 135–145)
Total Bilirubin: 0.6 mg/dL (ref 0.3–1.2)
Total Protein: 6.7 g/dL (ref 6.5–8.1)

## 2019-01-15 MED ORDER — DOXYCYCLINE HYCLATE 100 MG PO TABS: 100.0000 mg | ORAL_TABLET | Freq: Two times a day (BID) | ORAL | 0 refills | Status: DC

## 2019-01-15 MED ORDER — METRONIDAZOLE 500 MG PO TABS: 500.0000 mg | ORAL_TABLET | Freq: Two times a day (BID) | ORAL | 0 refills | Status: DC

## 2019-01-15 MED ORDER — TRAMADOL HCL 50 MG PO TABS: 50.0000 mg | ORAL_TABLET | Freq: Four times a day (QID) | ORAL | 0 refills | Status: AC | PRN

## 2019-01-15 MED FILL — metroNIDAZOLE 500 MG TABS: 500 | 14 days supply | Qty: 28 | Fill #0

## 2019-01-15 MED FILL — traMADol HCL 50 MG TABS: 50 | 4 days supply | Qty: 12 | Fill #0

## 2019-01-15 MED FILL — DOXYCYCLINE HYCLATE 100 MG: 100 | 14 days supply | Qty: 28 | Fill #0

## 2019-01-15 NOTE — Discharge Summary (Signed)
Physician Discharge Summary  Joanna Smith [id number] DOB: Nov 22, 1985 DOA: 01/11/2019  PCP: Patient, No Pcp Per  Admit date: 01/11/2019 Discharge date: 01/15/2019  Admitted From: home Disposition:  home  Recommendations for Outpatient Follow-up:  1. Follow up with PCP in 1-2, IR and GYN as instructed 2. Please obtain BMP/CBC in one week 3. Please follow up on the following pending results:  Home Health:no  Equipment/Devices: drain   Discharge Condition: Stable CODE STATUS: FULL Diet recommendation: Regular diet  Brief/Interim Summary:  33 y.o.femalewith medical history significant Of PID, TOA presented withabdominal pain congestion no nausea vomiting no diarrhea has had significant cough for past 4 days PTA occasionally productive of sputum she had initially cold-like symptoms last week but now settled into her chest. She has occasional chest pain with coughing. No leg swelling no hemoptysis no personal history of PE or DVT. Patient denied any sick contacts she try to use Zyrtec but did not seem to help. Having fevers last night and feels like her abdomen right lower quadrant pain feels similar to when she had fluid in her fallopian tubes in the past no vaginal discharge Patient initially stated to emergency department that [facility] virus testing has already virus on them and that she felt"set up because she is a black woman"for being tested for coronavirus She eventually agreed to get tested While in ER: CT abdomen showed evidence of PID as well as left hydrosalpinx and 9.5 cm complex cystic lesion of pelvic cul-de-sac likely secondary to chronic left tubo-ovarian abscess OB/GYN was consulted requesting medicine to admit given respiratory symptoms although patient is COVID negative Later on respiratory panel came back with rhinovirus which is likely explaining patient's respiratory symptoms. Patient is admitted , seen by gynecologist today with IV antibiotics.  Seen by IR and  drain placed on tubo-ovarian abscess with good pus drainage. At this time she has been afebrile more than 4 hours, leukocytosis initially of 20.3K resolved to 10.8 K. Cleared for discharge by GYN on Doxy and Flagyl for 2 weeks She will follow-up with IR for drain removal and also with gynecologist.  She will be discharged on appropriate drain instruction by IR.  Assessment and plan  Acute PID with tubo-ovarian abscess :TVS ultrasound showing bilateral hydrosalpinx considered PID and large complex cyst posterior to uterus possible tubo-ovarian abscess.  Appreciate OB/GYN consult on board, status post IR drainage placement-draining well.  Cleared for discharge and going home on Doxy and Flagyl for 2 weeks with instruction for drain management and drain removal by IR and with GYN follow-up.  Also gave  prescription for tramadol for pain control.  Sepsis without acute organ dysfunction: Sepsis parameters resolved.  Continue antibiotics as above  Upper respiratory illness with positive for rhinovirus: Supportive care.  Multiple uterine fibroids.  Polysubstance abuse counseled against cessation  Hypomagnesemia/Hypokalemia -resolved.  Gastritis -stable  Constipation -cont  Colace disussed with the gynecologist regarding plan of care for discharge DVT prophylaxis: SCD Code Status: Full code Family Communication: plan of care discussed with patient in detail. Disposition Plan: Home   Consultants: GYN, IR  Procedures: For drains placement 10/15  TVUS  1. Negative for ovarian torsion 2. Bilateral hydrosalpinx with slightly thickened wall and scattered internal echoes which could be secondary to acute inflammation/infection/acute PID. 3. Large 10.6 cm complex cyst posterior to the cervix, associated with left adnexa. Cyst appears more thick walled with less homogeneous internal echoes and is slightly increased in size. Mass remains indeterminate, but could represent  tubo-ovarian abscess  given inflammatory changes at the adnexa. 4. Large uterine fibroids  CT abdomen IMPRESSION: Mild increase in size of right hydrosalpinx with mild adjacent inflammatory changes, consistent with acute pelvic inflammatory disease.  Stable left hydrosalpinx and 9.5 cm complex cystic lesion in pelvic cul-de-sac, likely representing chronic left tubo-ovarian abscess, with cystic neoplasm of left ovary considered less likely.  Multiple uterine fibroids.  No evidence of appendicitis.  Microbiology: [id number] negative. Respiratory panel positive for rhinovirus/enterovirus Discharge Diagnoses:  Active Problems:   PID (acute pelvic inflammatory disease)   TOA (tubo-ovarian abscess)   Sepsis without acute organ dysfunction (HCC)   Suspected COVID-19 virus infection    Discharge Instructions  Discharge Instructions    Diet - low sodium heart healthy   Complete by: As directed    Discharge instructions   Complete by: As directed    Follow-up with OB/GYN Dr. Rip Harbour office Follow-up with PCP Please follow-up with interventional radiology for drain removal as instructed   Increase activity slowly   Complete by: As directed      Allergies as of 01/15/2019      Reactions   Macadamia Nut Oil Anaphylaxis      Medication List    TAKE these medications   aspirin EC 81 MG tablet Take 81 mg by mouth every 6 (six) hours as needed for mild pain.   doxycycline 100 MG tablet Commonly known as: VIBRA-TABS Take 1 tablet (100 mg total) by mouth every 12 (twelve) hours for 14 days.   ibuprofen 600 MG tablet Commonly known as: ADVIL Take 1 tablet (600 mg total) by mouth every 6 (six) hours as needed. What changed: reasons to take this   metroNIDAZOLE 500 MG tablet Commonly known as: Flagyl Take 1 tablet (500 mg total) by mouth 2 (two) times daily for 14 days.   traMADol 50 MG tablet Commonly known as: Ultram Take 1 tablet (50 mg total) by mouth every 6  (six) hours as needed for up to 3 days for severe pain.      Follow-up Information    [facility]. Go on 02/01/2019.   Why: at 9:50am Contact information: [address], [address] [place] [phone]       Greggory Keen, MD Follow up.   Specialties: Interventional Radiology, Radiology Why: IR scheduler will call you with appointment date/time. Please call with any questions or concerns prioro to your appointment.  Contact information: [place] [address] [place] [address] [phone]          Allergies  Allergen Reactions  . Macadamia Nut Oil Anaphylaxis    Consultations:  GYN  IR  Procedures/Studies: US Transvaginal Non-ob  Result Date: 01/11/2019 CLINICAL DATA:  Abdominal pain with abnormal CT EXAM: TRANSABDOMINAL AND TRANSVAGINAL ULTRASOUND OF PELVIS DOPPLER ULTRASOUND OF OVARIES TECHNIQUE: Both transabdominal and transvaginal ultrasound examinations of the pelvis were performed. Transabdominal technique was performed for global imaging of the pelvis including uterus, ovaries, adnexal regions, and pelvic cul-de-sac. It was necessary to proceed with endovaginal exam following the transabdominal exam to visualize the adnexa. Color and duplex Doppler ultrasound was utilized to evaluate blood flow to the ovaries. COMPARISON:  CT 01/11/2019, ultrasound 03/26/2018, CT 03/26/2018 FINDINGS: Uterus Measurements: 11.2 x 5.8 x 6.1 cm = volume: 204.5 mL. Multiple large uterine fibroids. Exophytic fundal fibroid measures 6.8 x 6 x 6.5 cm. Pedunculated fibroid off the lower anterior uterine wall measures 6.2 x 4.5 x 5.7 cm. Endometrium Thickness: 6.3 mm.  No focal abnormality  visualized. Right ovary Measurements: 2.7 x 1.8 x 2.2 cm = volume: 5.5 mL. Tortuous tubular structure in the right adnexa, felt consistent with hydrosalpinx. Slightly thickened wall and minimal scattered internal echoes. Left ovary Measurements: 5.4 x 2.5 x 3.6 cm =  volume: 25.4 mL. Dilated tubular structure in the left adnexa with slightly thickened wall, felt consistent with hydrosalpinx. Few internal scattered echoes. Posterior to the cervix is a large complex cyst measuring 10.6 x 8.1 x 8.7 cm with flow at the periphery. Cyst contains internal scattered echoes and slightly echogenic debris. Previously this was more homogeneous and measured 8.1 x 8.9 by 7.5 cm. Pulsed Doppler evaluation of both ovaries demonstrates normal low-resistance arterial and venous waveforms. Other findings No abnormal free fluid. IMPRESSION: 1. Negative for ovarian torsion 2. Bilateral hydrosalpinx with slightly thickened wall and scattered internal echoes which could be secondary to acute inflammation/infection/acute PID. 3. Large 10.6 cm complex cyst posterior to the cervix, associated with left adnexa. Cyst appears more thick walled with less homogeneous internal echoes and is slightly increased in size. Mass remains indeterminate, but could represent tubo-ovarian abscess given inflammatory changes at the adnexa. 4. Large uterine fibroids Electronically Signed   By: Donavan Foil M.D.   On: 01/11/2019 20:10   US Pelvis Complete  Result Date: 01/11/2019 CLINICAL DATA:  Abdominal pain with abnormal CT EXAM: TRANSABDOMINAL AND TRANSVAGINAL ULTRASOUND OF PELVIS DOPPLER ULTRASOUND OF OVARIES TECHNIQUE: Both transabdominal and transvaginal ultrasound examinations of the pelvis were performed. Transabdominal technique was performed for global imaging of the pelvis including uterus, ovaries, adnexal regions, and pelvic cul-de-sac. It was necessary to proceed with endovaginal exam following the transabdominal exam to visualize the adnexa. Color and duplex Doppler ultrasound was utilized to evaluate blood flow to the ovaries. COMPARISON:  CT 01/11/2019, ultrasound 03/26/2018, CT 03/26/2018 FINDINGS: Uterus Measurements: 11.2 x 5.8 x 6.1 cm = volume: 204.5 mL. Multiple large uterine fibroids. Exophytic  fundal fibroid measures 6.8 x 6 x 6.5 cm. Pedunculated fibroid off the lower anterior uterine wall measures 6.2 x 4.5 x 5.7 cm. Endometrium Thickness: 6.3 mm.  No focal abnormality visualized. Right ovary Measurements: 2.7 x 1.8 x 2.2 cm = volume: 5.5 mL. Tortuous tubular structure in the right adnexa, felt consistent with hydrosalpinx. Slightly thickened wall and minimal scattered internal echoes. Left ovary Measurements: 5.4 x 2.5 x 3.6 cm = volume: 25.4 mL. Dilated tubular structure in the left adnexa with slightly thickened wall, felt consistent with hydrosalpinx. Few internal scattered echoes. Posterior to the cervix is a large complex cyst measuring 10.6 x 8.1 x 8.7 cm with flow at the periphery. Cyst contains internal scattered echoes and slightly echogenic debris. Previously this was more homogeneous and measured 8.1 x 8.9 by 7.5 cm. Pulsed Doppler evaluation of both ovaries demonstrates normal low-resistance arterial and venous waveforms. Other findings No abnormal free fluid. IMPRESSION: 1. Negative for ovarian torsion 2. Bilateral hydrosalpinx with slightly thickened wall and scattered internal echoes which could be secondary to acute inflammation/infection/acute PID. 3. Large 10.6 cm complex cyst posterior to the cervix, associated with left adnexa. Cyst appears more thick walled with less homogeneous internal echoes and is slightly increased in size. Mass remains indeterminate, but could represent tubo-ovarian abscess given inflammatory changes at the adnexa. 4. Large uterine fibroids Electronically Signed   By: Donavan Foil M.D.   On: 01/11/2019 20:10   Ct Abdomen Pelvis W Contrast  Result Date: 01/11/2019 CLINICAL DATA:  Acute onset abdominal pain beginning last night. Suspected appendicitis. EXAM:  CT ABDOMEN AND PELVIS WITH CONTRAST TECHNIQUE: Multidetector CT imaging of the abdomen and pelvis was performed using the standard protocol following bolus administration of intravenous contrast.  CONTRAST:  128mL OMNIPAQUE IOHEXOL 300 MG/ML SOLN, <See Chart> OMNIPAQUE IOHEXOL 300 MG/ML SOLN COMPARISON:  03/26/2018 FINDINGS: Lower Chest: No acute findings. Hepatobiliary: No hepatic masses identified. Gallbladder is unremarkable. No evidence of biliary ductal dilatation. Pancreas:  No mass or inflammatory changes. Spleen: Within normal limits in size and appearance. Adrenals/Urinary Tract: No masses identified. No evidence of hydronephrosis. Stomach/Bowel: No evidence of obstruction, inflammatory process or abnormal fluid collections. Normal appendix visualized. Vascular/Lymphatic: No pathologically enlarged lymph nodes. No abdominal aortic aneurysm. Reproductive: Several uterine fibroids are again seen. Largest in the anterior lower uterine segment measures 9 cm in maximum diameter. Another fibroid in the fundus measures 6 cm and shows diffuse hypervascular enhancement. Right-sided hydrosalpinx shows mild increase in size, with increased mural enhancement and mild adjacent inflammatory changes, consistent with tubo-ovarian abscess. Left hydrosalpinx shows no significant change. A complex cystic lesion which contains several enhancing internal septations is again seen in the pelvic cul-de-sac, currently measuring 9.5 x 7.7 cm compared to 9.2 x 7.9 cm previously. This likely represents a chronic left tubo-ovarian abscess, with cystic neoplasm of the left ovary considered less likely. No evidence of free fluid. Other:  None. Musculoskeletal:  No suspicious bone lesions identified. IMPRESSION: Mild increase in size of right hydrosalpinx with mild adjacent inflammatory changes, consistent with acute pelvic inflammatory disease. Stable left hydrosalpinx and 9.5 cm complex cystic lesion in pelvic cul-de-sac, likely representing chronic left tubo-ovarian abscess, with cystic neoplasm of left ovary considered less likely. Multiple uterine fibroids. No evidence of appendicitis. Electronically Signed   By: Marlaine Hind  M.D.   On: 01/11/2019 17:22   [place] Art/ven Flow Abd Pelv Doppler  Result Date: 01/11/2019 CLINICAL DATA:  Abdominal pain with abnormal CT EXAM: TRANSABDOMINAL AND TRANSVAGINAL ULTRASOUND OF PELVIS DOPPLER ULTRASOUND OF OVARIES TECHNIQUE: Both transabdominal and transvaginal ultrasound examinations of the pelvis were performed. Transabdominal technique was performed for global imaging of the pelvis including uterus, ovaries, adnexal regions, and pelvic cul-de-sac. It was necessary to proceed with endovaginal exam following the transabdominal exam to visualize the adnexa. Color and duplex Doppler ultrasound was utilized to evaluate blood flow to the ovaries. COMPARISON:  CT 01/11/2019, ultrasound 03/26/2018, CT 03/26/2018 FINDINGS: Uterus Measurements: 11.2 x 5.8 x 6.1 cm = volume: 204.5 mL. Multiple large uterine fibroids. Exophytic fundal fibroid measures 6.8 x 6 x 6.5 cm. Pedunculated fibroid off the lower anterior uterine wall measures 6.2 x 4.5 x 5.7 cm. Endometrium Thickness: 6.3 mm.  No focal abnormality visualized. Right ovary Measurements: 2.7 x 1.8 x 2.2 cm = volume: 5.5 mL. Tortuous tubular structure in the right adnexa, felt consistent with hydrosalpinx. Slightly thickened wall and minimal scattered internal echoes. Left ovary Measurements: 5.4 x 2.5 x 3.6 cm = volume: 25.4 mL. Dilated tubular structure in the left adnexa with slightly thickened wall, felt consistent with hydrosalpinx. Few internal scattered echoes. Posterior to the cervix is a large complex cyst measuring 10.6 x 8.1 x 8.7 cm with flow at the periphery. Cyst contains internal scattered echoes and slightly echogenic debris. Previously this was more homogeneous and measured 8.1 x 8.9 by 7.5 cm. Pulsed Doppler evaluation of both ovaries demonstrates normal low-resistance arterial and venous waveforms. Other findings No abnormal free fluid. IMPRESSION: 1. Negative for ovarian torsion 2. Bilateral hydrosalpinx with slightly thickened wall  and scattered internal echoes which could  be secondary to acute inflammation/infection/acute PID. 3. Large 10.6 cm complex cyst posterior to the cervix, associated with left adnexa. Cyst appears more thick walled with less homogeneous internal echoes and is slightly increased in size. Mass remains indeterminate, but could represent tubo-ovarian abscess given inflammatory changes at the adnexa. 4. Large uterine fibroids Electronically Signed   By: Donavan Foil M.D.   On: 01/11/2019 20:10   Dg Chest Port 1 View  Result Date: 01/11/2019 CLINICAL DATA:  Tachycardia with cough and fevers EXAM: PORTABLE CHEST 1 VIEW COMPARISON:  None. FINDINGS: Lungs are clear. Heart size and pulmonary vascularity are normal. No adenopathy. No pneumothorax. No bone lesions. IMPRESSION: No edema or consolidation. Electronically Signed   By: Lowella Grip III M.D.   On: 01/11/2019 13:41   Ct Image Guided Drainage By Percutaneous Catheter  Result Date: 01/14/2019 INDICATION: PID, posterior dependent pelvic abscess EXAM: CT DRAINAGE PELVIC ABSCESS VIA RIGHT TRANS GLUTEAL APPROACH MEDICATIONS: The patient is currently admitted to the hospital and receiving intravenous antibiotics. The antibiotics were administered within an appropriate time frame prior to the initiation of the procedure. ANESTHESIA/SEDATION: Fentanyl 100 mcg IV; Versed 2.0 mg IV, 4 MG ZOFRAN Moderate Sedation Time:  82 MINUTES The patient was continuously monitored during the procedure by the interventional radiology nurse under my direct supervision. COMPLICATIONS: None immediate. PROCEDURE: Informed written consent was obtained from the patient after a thorough discussion of the procedural risks, benefits and alternatives. All questions were addressed. Maximal Sterile Barrier Technique was utilized including caps, mask, sterile gowns, sterile gloves, sterile drape, hand hygiene and skin antiseptic. A timeout was performed prior to the initiation of the  procedure. Previous imaging reviewed. Patient position prone. Noncontrast localization CT performed. The large posterior pelvic abscess was localized. Overlying skin marked for a right trans gluteal approach. Under sterile conditions and local anesthesia, an 18 gauge 10 cm access needle was advanced from a right trans gluteal approach into the fluid collection. Needle position confirmed with CT. Syringe aspiration yielded purulent fluid. Sample sent for culture. Guidewire inserted followed by tract dilatation to insert a 10 [place] drain. Drain catheter position confirmed with CT. Syringe aspiration yielded a total of 260 cc thick purulent fluid. Catheter flushed with saline and connected to external suction bulb. Catheter secured with Prolene suture and a sterile dressing. No immediate complication. Patient tolerated the procedure well. IMPRESSION: Successful CT-guided trans gluteal pelvic abscess drain placement as above Electronically Signed   By: Jerilynn Mages.  Shick M.D.   On: 01/14/2019 15:13     Subjective: Resting well and afebrile, no new complaints.  Draining pus from the tubo-ovarian abscess drain  Discharge Exam: Vitals:   01/15/19 0035 01/15/19 0541  BP: 124/74 (!) 158/70  Pulse: 82 89  Resp: 17 17  Temp: 98.9 F (37.2 C) 99.4 F (37.4 C)  SpO2: 98% 100%   Vitals:   01/14/19 1500 01/14/19 1846 01/15/19 0035 01/15/19 0541  BP:  128/88 124/74 (!) 158/70  Pulse:  70 82 89  Resp:  18 17 17   Temp: 98.4 F (36.9 C) 98 F (36.7 C) 98.9 F (37.2 C) 99.4 F (37.4 C)  TempSrc: Oral Oral Oral Oral  SpO2:  100% 98% 100%  Weight:      Height:        General: Pt is alert, awake, not in acute distress Cardiovascular: RRR, S1/S2 +, no rubs, no gallops Respiratory: CTA bilaterally, no wheezing, no rhonchi Abdominal: Soft, NT, ND, bowel sounds + Extremities: no edema, no  cyanosis   The results of significant diagnostics from this hospitalization (including imaging, microbiology, ancillary and  laboratory) are listed below for reference.     Microbiology: Recent Results (from the past 240 hour(s))  SARS CORONAVIRUS 2 (TAT 6-24 HRS) Nasopharyngeal Nasopharyngeal Swab     Status: None   Collection Time: 01/11/19  1:24 PM   Specimen: Nasopharyngeal Swab  Result Value Ref Range Status   SARS Coronavirus 2 NEGATIVE NEGATIVE Final    Comment: (NOTE) SARS-CoV-2 target nucleic acids are NOT DETECTED. The SARS-CoV-2 RNA is generally detectable in upper and lower respiratory specimens during the acute phase of infection. Negative results do not preclude SARS-CoV-2 infection, do not rule out co-infections with other pathogens, and should not be used as the sole basis for treatment or other patient management decisions. Negative results must be combined with clinical observations, patient history, and epidemiological information. The expected result is Negative. Fact Sheet for Patients: SugarRoll.be Fact Sheet for Healthcare Providers: https://www.woods-mathews.com/ This test is not yet approved or cleared by the [place] FDA and  has been authorized for detection and/or diagnosis of SARS-CoV-2 by FDA under an Emergency Use Authorization (EUA). This EUA will remain  in effect (meaning this test can be used) for the duration of the COVID-19 declaration under Section 56 4(b)(1) of the Act, 21 U.S.C. section 360bbb-3(b)(1), unless the authorization is terminated or revoked sooner. Performed at [facility], [place] [address] [place], [place] [address]   Wet prep, genital     Status: Abnormal   Collection Time: 01/11/19  3:18 PM   Specimen: PATH Cytology Cervicovaginal Ancillary Only  Result Value Ref Range Status   Yeast Wet Prep HPF POC NONE SEEN NONE SEEN Final   Trich, Wet Prep NONE SEEN NONE SEEN Final   Clue Cells Wet Prep HPF POC NONE SEEN NONE SEEN Final   WBC, Wet Prep HPF POC FEW (A) NONE SEEN Final    Comment: Specimen  diluted due to transport tube containing more than 1 ml of saline, interpret results with caution.   Sperm NONE SEEN  Final    Comment: Performed at [facility], [place] [address] [place], [place] [address]  Culture, blood (routine x 2)     Status: None (Preliminary result)   Collection Time: 01/11/19  6:10 PM   Specimen: BLOOD  Result Value Ref Range Status   Specimen Description BLOOD RIGHT ANTECUBITAL  Final   Special Requests   Final    BOTTLES DRAWN AEROBIC AND ANAEROBIC Blood Culture adequate volume   Culture   Final    NO GROWTH 4 DAYS Performed at [facility], [place] [address] [place], [place] [address]    Report Status PENDING  Incomplete  Culture, blood (routine x 2)     Status: None (Preliminary result)   Collection Time: 01/11/19  6:15 PM   Specimen: BLOOD  Result Value Ref Range Status   Specimen Description BLOOD LEFT ANTECUBITAL  Final   Special Requests   Final    BOTTLES DRAWN AEROBIC AND ANAEROBIC Blood Culture results may not be optimal due to an excessive volume of blood received in culture bottles   Culture   Final    NO GROWTH 4 DAYS Performed at [facility], [place] [address], [place], [place] [address]    Report Status PENDING  Incomplete  Respiratory Panel by PCR     Status: Abnormal   Collection Time: 01/11/19  7:02 PM  Specimen: Nasopharyngeal Swab; Respiratory  Result Value Ref Range Status   Adenovirus NOT DETECTED NOT DETECTED Final   Coronavirus 229E NOT DETECTED NOT DETECTED Final    Comment: (NOTE) The Coronavirus on the Respiratory Panel, DOES NOT test for the novel  Coronavirus (2019 nCoV)    Coronavirus HKU1 NOT DETECTED NOT DETECTED Final   Coronavirus NL63 NOT DETECTED NOT DETECTED Final   Coronavirus OC43 NOT DETECTED NOT DETECTED Final   Metapneumovirus NOT DETECTED NOT DETECTED Final   Rhinovirus / Enterovirus DETECTED (A) NOT DETECTED Final   Influenza A NOT DETECTED NOT DETECTED Final   Influenza B NOT DETECTED NOT  DETECTED Final   Parainfluenza Virus 1 NOT DETECTED NOT DETECTED Final   Parainfluenza Virus 2 NOT DETECTED NOT DETECTED Final   Parainfluenza Virus 3 NOT DETECTED NOT DETECTED Final   Parainfluenza Virus 4 NOT DETECTED NOT DETECTED Final   Respiratory Syncytial Virus NOT DETECTED NOT DETECTED Final   Bordetella pertussis NOT DETECTED NOT DETECTED Final   Chlamydophila pneumoniae NOT DETECTED NOT DETECTED Final   Mycoplasma pneumoniae NOT DETECTED NOT DETECTED Final    Comment: Performed at [facility], [place]. [address], [place], [place] [address]  Aerobic/Anaerobic Culture (surgical/deep wound)     Status: None (Preliminary result)   Collection Time: 01/14/19  2:36 PM   Specimen: Abscess  Result Value Ref Range Status   Specimen Description ABSCESS ABDOMEN  Final   Special Requests Normal  Final   Gram Stain   Final    FEW WBC PRESENT,BOTH PMN AND MONONUCLEAR NO ORGANISMS SEEN Performed at [facility], [address], [place], [place] [address]    Culture PENDING  Incomplete   Report Status PENDING  Incomplete     Labs: BNP (last 3 results) No results for input(s): BNP in the last 8760 hours. Basic Metabolic Panel: Recent Labs  Lab 01/11/19 1324 01/12/19 0231 01/13/19 0458 01/14/19 0729 01/15/19 0048  NA 136 137 136 137 137  K 3.8 3.7 3.1* 3.9 3.6  CL 104 102 103 104 105  CO2 22 22 24 22 23   GLUCOSE 101* 73 87 94 94  BUN 7 5* <5* <5* <5*  CREATININE 0.73 0.92 0.88 0.86 0.86  CALCIUM 8.6* 8.2* 7.8* 8.2* 8.3*  MG  --  1.5* 1.6* 2.0  --   PHOS  --  3.5  --   --   --    Liver Function Tests: Recent Labs  Lab 01/11/19 1324 01/12/19 0231 01/13/19 0458 01/15/19 0048  AST 13* 13* 11* 12*  ALT 11 6 10 8   ALKPHOS 72 87 67 67  BILITOT 0.7 0.7 1.0 0.6  PROT 7.1 6.5 6.3* 6.7  ALBUMIN 3.5 3.2* 2.8* 2.9*   Recent Labs  Lab 01/11/19 1324  LIPASE 24   No results for input(s): AMMONIA in the last 168 hours. CBC: Recent Labs  Lab 01/11/19 1324  01/12/19 0231 01/13/19 0458 01/14/19 0729 01/15/19 0048  WBC 23.9* 22.3* 18.9* 12.1* 10.8*  NEUTROABS 18.7*  --  13.9* 9.4*  --   HGB 13.5 12.6 11.6* 13.0 12.5  HCT 42.0 39.4 36.7 39.9 39.3  MCV 86.1 87.0 85.7 85.1 84.5  PLT 261 268 247 250 295   Cardiac Enzymes: No results for input(s): CKTOTAL, CKMB, CKMBINDEX, TROPONINI in the last 168 hours. BNP: Invalid input(s): POCBNP CBG: No results for input(s): GLUCAP in the last 168 hours. D-Dimer No results for input(s): DDIMER in the last 72 hours. Hgb A1c  No results for input(s): HGBA1C in the last 72 hours. Lipid Profile No results for input(s): CHOL, HDL, LDLCALC, TRIG, CHOLHDL, LDLDIRECT in the last 72 hours. Thyroid function studies No results for input(s): TSH, T4TOTAL, T3FREE, THYROIDAB in the last 72 hours.  Invalid input(s): FREET3 Anemia work up No results for input(s): VITAMINB12, FOLATE, FERRITIN, TIBC, IRON, RETICCTPCT in the last 72 hours. Urinalysis    Component Value Date/Time   COLORURINE YELLOW 01/11/2019 1427   APPEARANCEUR HAZY (A) 01/11/2019 1427   LABSPEC 1.008 01/11/2019 1427   PHURINE 7.0 01/11/2019 1427   GLUCOSEU NEGATIVE 01/11/2019 1427   HGBUR NEGATIVE 01/11/2019 1427   BILIRUBINUR NEGATIVE 01/11/2019 1427   [place] 01/11/2019 1427   PROTEINUR NEGATIVE 01/11/2019 1427   NITRITE NEGATIVE 01/11/2019 1427   LEUKOCYTESUR TRACE (A) 01/11/2019 1427   Sepsis Labs Invalid input(s): PROCALCITONIN,  WBC,  LACTICIDVEN Microbiology Recent Results (from the past 240 hour(s))  SARS CORONAVIRUS 2 (TAT 6-24 HRS) Nasopharyngeal Nasopharyngeal Swab     Status: None   Collection Time: 01/11/19  1:24 PM   Specimen: Nasopharyngeal Swab  Result Value Ref Range Status   SARS Coronavirus 2 NEGATIVE NEGATIVE Final    Comment: (NOTE) SARS-CoV-2 target nucleic acids are NOT DETECTED. The SARS-CoV-2 RNA is generally detectable in upper and lower respiratory specimens during the acute phase of infection.  Negative results do not preclude SARS-CoV-2 infection, do not rule out co-infections with other pathogens, and should not be used as the sole basis for treatment or other patient management decisions. Negative results must be combined with clinical observations, patient history, and epidemiological information. The expected result is Negative. Fact Sheet for Patients: SugarRoll.be Fact Sheet for Healthcare Providers: https://www.woods-mathews.com/ This test is not yet approved or cleared by the [place] FDA and  has been authorized for detection and/or diagnosis of SARS-CoV-2 by FDA under an Emergency Use Authorization (EUA). This EUA will remain  in effect (meaning this test can be used) for the duration of the COVID-19 declaration under Section 56 4(b)(1) of the Act, 21 U.S.C. section 360bbb-3(b)(1), unless the authorization is terminated or revoked sooner. Performed at [facility], [place] [address] [place], [place] [address]   Wet prep, genital     Status: Abnormal   Collection Time: 01/11/19  3:18 PM   Specimen: PATH Cytology Cervicovaginal Ancillary Only  Result Value Ref Range Status   Yeast Wet Prep HPF POC NONE SEEN NONE SEEN Final   Trich, Wet Prep NONE SEEN NONE SEEN Final   Clue Cells Wet Prep HPF POC NONE SEEN NONE SEEN Final   WBC, Wet Prep HPF POC FEW (A) NONE SEEN Final    Comment: Specimen diluted due to transport tube containing more than 1 ml of saline, interpret results with caution.   Sperm NONE SEEN  Final    Comment: Performed at [facility], [place] [address], [place], [place] [address]  Culture, blood (routine x 2)     Status: None (Preliminary result)   Collection Time: 01/11/19  6:10 PM   Specimen: BLOOD  Result Value Ref Range Status   Specimen Description BLOOD RIGHT ANTECUBITAL  Final   Special Requests   Final    BOTTLES DRAWN AEROBIC AND ANAEROBIC Blood Culture adequate volume   Culture    Final    NO GROWTH 4 DAYS Performed at [facility], [place] [address] [place], [place] [address]    Report Status PENDING  Incomplete  Culture, blood (  routine x 2)     Status: None (Preliminary result)   Collection Time: 01/11/19  6:15 PM   Specimen: BLOOD  Result Value Ref Range Status   Specimen Description BLOOD LEFT ANTECUBITAL  Final   Special Requests   Final    BOTTLES DRAWN AEROBIC AND ANAEROBIC Blood Culture results may not be optimal due to an excessive volume of blood received in culture bottles   Culture   Final    NO GROWTH 4 DAYS Performed at [facility], [place] [address] [place], [place] [address]    Report Status PENDING  Incomplete  Respiratory Panel by PCR     Status: Abnormal   Collection Time: 01/11/19  7:02 PM   Specimen: Nasopharyngeal Swab; Respiratory  Result Value Ref Range Status   Adenovirus NOT DETECTED NOT DETECTED Final   Coronavirus 229E NOT DETECTED NOT DETECTED Final    Comment: (NOTE) The Coronavirus on the Respiratory Panel, DOES NOT test for the novel  Coronavirus (2019 nCoV)    Coronavirus HKU1 NOT DETECTED NOT DETECTED Final   Coronavirus NL63 NOT DETECTED NOT DETECTED Final   Coronavirus OC43 NOT DETECTED NOT DETECTED Final   Metapneumovirus NOT DETECTED NOT DETECTED Final   Rhinovirus / Enterovirus DETECTED (A) NOT DETECTED Final   Influenza A NOT DETECTED NOT DETECTED Final   Influenza B NOT DETECTED NOT DETECTED Final   Parainfluenza Virus 1 NOT DETECTED NOT DETECTED Final   Parainfluenza Virus 2 NOT DETECTED NOT DETECTED Final   Parainfluenza Virus 3 NOT DETECTED NOT DETECTED Final   Parainfluenza Virus 4 NOT DETECTED NOT DETECTED Final   Respiratory Syncytial Virus NOT DETECTED NOT DETECTED Final   Bordetella pertussis NOT DETECTED NOT DETECTED Final   Chlamydophila pneumoniae NOT DETECTED NOT DETECTED Final   Mycoplasma pneumoniae NOT DETECTED NOT DETECTED Final    Comment: Performed at [facility], [place].  [address], [place], [place] [address]  Aerobic/Anaerobic Culture (surgical/deep wound)     Status: None (Preliminary result)   Collection Time: 01/14/19  2:36 PM   Specimen: Abscess  Result Value Ref Range Status   Specimen Description ABSCESS ABDOMEN  Final   Special Requests Normal  Final   Gram Stain   Final    FEW WBC PRESENT,BOTH PMN AND MONONUCLEAR NO ORGANISMS SEEN Performed at [facility], [address] [place], [place] [address]    Culture PENDING  Incomplete   Report Status PENDING  Incomplete     Time coordinating discharge: 25 minutes  SIGNED:   Antonieta Pert, MD  [facility] 01/15/2019, 12:07 PM  If 7PM-7AM, please contact night-coverage [email or web address]

## 2019-01-15 NOTE — Progress Notes (Signed)
Subjective: Patient reports feeling much better. Very little pain this morning.  Objective: I have reviewed patient's vital signs.  PE  Lungs clear Heart RRR Abd soft + BS min tenderness, drain in place  Assessment/Plan: PID/TOA, uterine fibroids and bilateral hydrosalpinx  S/P percutaneous drain by IR, good results.  Follow up and removal of drain per IR Ok to discharge for GYN standpoint. Doxycycline 100 mg po bid and Flagy 500 mg po bid for total of 14 days of antibiotics. Will schedule follow up appt in our [place] office in 3-4 weeks  Thank you for allowing [place] to participate in the care of [name].    LOS: 4 days    Chancy Milroy, MD 01/15/2019, 10:17 AM

## 2019-01-15 NOTE — Progress Notes (Signed)
Pt complaining of burning when flushing Left forearm IV. Flushes with no blood return. IV medications paused and provider made aware. IV team consulted to assess IV.

## 2019-01-15 NOTE — Progress Notes (Signed)
Referring Physician(s): Dr. Arlina Robes  Supervising Physician: Corrie Mckusick  Patient Status:  [facility] - In-pt  Chief Complaint: Follow up right pelvic abscess drain placed 01/14/19 by Dr. Annamaria Boots  Subjective:  Joanna Smith states she is feeling well, a little sore around the drain insertion site but otherwise feels ok. She is concerned she will have a lot of pain after she returns home and is asking what pain medication she will be given upon discharge. She is concerned about drain care when she returns home as the drain is in a difficult spot to see/access - she has family at home who can help with drain care. She is looking forward to going home today.   Allergies: Macadamia nut oil  Medications: Prior to Admission medications   Medication Sig Start Date End Date Taking? Authorizing Provider  aspirin EC 81 MG tablet Take 81 mg by mouth every 6 (six) hours as needed for mild pain.    Yes [provider]  ibuprofen (ADVIL) 600 MG tablet Take 1 tablet (600 mg total) by mouth every 6 (six) hours as needed. Patient taking differently: Take 600 mg by mouth every 6 (six) hours as needed for moderate pain.  11/25/18  Yes Melynda Ripple, MD  doxycycline (VIBRA-TABS) 100 MG tablet Take 1 tablet (100 mg total) by mouth every 12 (twelve) hours for 14 days. 01/15/19 01/29/19  Antonieta Pert, MD  metroNIDAZOLE (FLAGYL) 500 MG tablet Take 1 tablet (500 mg total) by mouth 2 (two) times daily for 14 days. 01/15/19 01/29/19  Antonieta Pert, MD     Vital Signs: BP (!) 158/70 (BP Location: Left Arm)    Pulse 89    Temp 99.4 F (37.4 C) (Oral)    Resp 17    Ht 5\' 2"  (1.575 m)    Wt 160 lb (72.6 kg)    LMP 01/04/2019 (Approximate)    SpO2 100%    BMI 29.26 kg/m   Physical Exam Vitals signs and nursing note reviewed.  Constitutional:      General: She is not in acute distress. HENT:     Head: Normocephalic.  Cardiovascular:     Rate and Rhythm: Normal rate.  Pulmonary:     Effort: Pulmonary  effort is normal.  Abdominal:     General: Bowel sounds are normal. There is no distension.     Palpations: Abdomen is soft.     Tenderness: There is no abdominal tenderness.     Comments: (+) pelvic drain via right TG approach to suction with ~15 cc thin, white fluid in bulb. Insertion site clean, dry, dressed appropriately. Suture and stat lock in tact. No active bleeding, discharge or drainage from insertion site. Drain flushes easily on my exam. Minimal tenderness to palpation of insertion site.  Skin:    General: Skin is warm and dry.  Neurological:     Mental Status: She is alert. Mental status is at baseline.  Psychiatric:        Mood and Affect: Mood normal.        Behavior: Behavior normal.        Thought Content: Thought content normal.        Judgment: Judgment normal.     Imaging: US Transvaginal Non-ob  Result Date: 01/11/2019 CLINICAL DATA:  Abdominal pain with abnormal CT EXAM: TRANSABDOMINAL AND TRANSVAGINAL ULTRASOUND OF PELVIS DOPPLER ULTRASOUND OF OVARIES TECHNIQUE: Both transabdominal and transvaginal ultrasound examinations of the pelvis were performed. Transabdominal technique was performed for global imaging  of the pelvis including uterus, ovaries, adnexal regions, and pelvic cul-de-sac. It was necessary to proceed with endovaginal exam following the transabdominal exam to visualize the adnexa. Color and duplex Doppler ultrasound was utilized to evaluate blood flow to the ovaries. COMPARISON:  CT 01/11/2019, ultrasound 03/26/2018, CT 03/26/2018 FINDINGS: Uterus Measurements: 11.2 x 5.8 x 6.1 cm = volume: 204.5 mL. Multiple large uterine fibroids. Exophytic fundal fibroid measures 6.8 x 6 x 6.5 cm. Pedunculated fibroid off the lower anterior uterine wall measures 6.2 x 4.5 x 5.7 cm. Endometrium Thickness: 6.3 mm.  No focal abnormality visualized. Right ovary Measurements: 2.7 x 1.8 x 2.2 cm = volume: 5.5 mL. Tortuous tubular structure in the right adnexa, felt consistent  with hydrosalpinx. Slightly thickened wall and minimal scattered internal echoes. Left ovary Measurements: 5.4 x 2.5 x 3.6 cm = volume: 25.4 mL. Dilated tubular structure in the left adnexa with slightly thickened wall, felt consistent with hydrosalpinx. Few internal scattered echoes. Posterior to the cervix is a large complex cyst measuring 10.6 x 8.1 x 8.7 cm with flow at the periphery. Cyst contains internal scattered echoes and slightly echogenic debris. Previously this was more homogeneous and measured 8.1 x 8.9 by 7.5 cm. Pulsed Doppler evaluation of both ovaries demonstrates normal low-resistance arterial and venous waveforms. Other findings No abnormal free fluid. IMPRESSION: 1. Negative for ovarian torsion 2. Bilateral hydrosalpinx with slightly thickened wall and scattered internal echoes which could be secondary to acute inflammation/infection/acute PID. 3. Large 10.6 cm complex cyst posterior to the cervix, associated with left adnexa. Cyst appears more thick walled with less homogeneous internal echoes and is slightly increased in size. Mass remains indeterminate, but could represent tubo-ovarian abscess given inflammatory changes at the adnexa. 4. Large uterine fibroids Electronically Signed   By: Donavan Foil M.D.   On: 01/11/2019 20:10   US Pelvis Complete  Result Date: 01/11/2019 CLINICAL DATA:  Abdominal pain with abnormal CT EXAM: TRANSABDOMINAL AND TRANSVAGINAL ULTRASOUND OF PELVIS DOPPLER ULTRASOUND OF OVARIES TECHNIQUE: Both transabdominal and transvaginal ultrasound examinations of the pelvis were performed. Transabdominal technique was performed for global imaging of the pelvis including uterus, ovaries, adnexal regions, and pelvic cul-de-sac. It was necessary to proceed with endovaginal exam following the transabdominal exam to visualize the adnexa. Color and duplex Doppler ultrasound was utilized to evaluate blood flow to the ovaries. COMPARISON:  CT 01/11/2019, ultrasound 03/26/2018,  CT 03/26/2018 FINDINGS: Uterus Measurements: 11.2 x 5.8 x 6.1 cm = volume: 204.5 mL. Multiple large uterine fibroids. Exophytic fundal fibroid measures 6.8 x 6 x 6.5 cm. Pedunculated fibroid off the lower anterior uterine wall measures 6.2 x 4.5 x 5.7 cm. Endometrium Thickness: 6.3 mm.  No focal abnormality visualized. Right ovary Measurements: 2.7 x 1.8 x 2.2 cm = volume: 5.5 mL. Tortuous tubular structure in the right adnexa, felt consistent with hydrosalpinx. Slightly thickened wall and minimal scattered internal echoes. Left ovary Measurements: 5.4 x 2.5 x 3.6 cm = volume: 25.4 mL. Dilated tubular structure in the left adnexa with slightly thickened wall, felt consistent with hydrosalpinx. Few internal scattered echoes. Posterior to the cervix is a large complex cyst measuring 10.6 x 8.1 x 8.7 cm with flow at the periphery. Cyst contains internal scattered echoes and slightly echogenic debris. Previously this was more homogeneous and measured 8.1 x 8.9 by 7.5 cm. Pulsed Doppler evaluation of both ovaries demonstrates normal low-resistance arterial and venous waveforms. Other findings No abnormal free fluid. IMPRESSION: 1. Negative for ovarian torsion 2. Bilateral hydrosalpinx with slightly thickened  wall and scattered internal echoes which could be secondary to acute inflammation/infection/acute PID. 3. Large 10.6 cm complex cyst posterior to the cervix, associated with left adnexa. Cyst appears more thick walled with less homogeneous internal echoes and is slightly increased in size. Mass remains indeterminate, but could represent tubo-ovarian abscess given inflammatory changes at the adnexa. 4. Large uterine fibroids Electronically Signed   By: Donavan Foil M.D.   On: 01/11/2019 20:10   Ct Abdomen Pelvis W Contrast  Result Date: 01/11/2019 CLINICAL DATA:  Acute onset abdominal pain beginning last night. Suspected appendicitis. EXAM: CT ABDOMEN AND PELVIS WITH CONTRAST TECHNIQUE: Multidetector CT imaging  of the abdomen and pelvis was performed using the standard protocol following bolus administration of intravenous contrast. CONTRAST:  155mL OMNIPAQUE IOHEXOL 300 MG/ML SOLN, <See Chart> OMNIPAQUE IOHEXOL 300 MG/ML SOLN COMPARISON:  03/26/2018 FINDINGS: Lower Chest: No acute findings. Hepatobiliary: No hepatic masses identified. Gallbladder is unremarkable. No evidence of biliary ductal dilatation. Pancreas:  No mass or inflammatory changes. Spleen: Within normal limits in size and appearance. Adrenals/Urinary Tract: No masses identified. No evidence of hydronephrosis. Stomach/Bowel: No evidence of obstruction, inflammatory process or abnormal fluid collections. Normal appendix visualized. Vascular/Lymphatic: No pathologically enlarged lymph nodes. No abdominal aortic aneurysm. Reproductive: Several uterine fibroids are again seen. Largest in the anterior lower uterine segment measures 9 cm in maximum diameter. Another fibroid in the fundus measures 6 cm and shows diffuse hypervascular enhancement. Right-sided hydrosalpinx shows mild increase in size, with increased mural enhancement and mild adjacent inflammatory changes, consistent with tubo-ovarian abscess. Left hydrosalpinx shows no significant change. A complex cystic lesion which contains several enhancing internal septations is again seen in the pelvic cul-de-sac, currently measuring 9.5 x 7.7 cm compared to 9.2 x 7.9 cm previously. This likely represents a chronic left tubo-ovarian abscess, with cystic neoplasm of the left ovary considered less likely. No evidence of free fluid. Other:  None. Musculoskeletal:  No suspicious bone lesions identified. IMPRESSION: Mild increase in size of right hydrosalpinx with mild adjacent inflammatory changes, consistent with acute pelvic inflammatory disease. Stable left hydrosalpinx and 9.5 cm complex cystic lesion in pelvic cul-de-sac, likely representing chronic left tubo-ovarian abscess, with cystic neoplasm of left  ovary considered less likely. Multiple uterine fibroids. No evidence of appendicitis. Electronically Signed   By: Marlaine Hind M.D.   On: 01/11/2019 17:22   [place] Art/ven Flow Abd Pelv Doppler  Result Date: 01/11/2019 CLINICAL DATA:  Abdominal pain with abnormal CT EXAM: TRANSABDOMINAL AND TRANSVAGINAL ULTRASOUND OF PELVIS DOPPLER ULTRASOUND OF OVARIES TECHNIQUE: Both transabdominal and transvaginal ultrasound examinations of the pelvis were performed. Transabdominal technique was performed for global imaging of the pelvis including uterus, ovaries, adnexal regions, and pelvic cul-de-sac. It was necessary to proceed with endovaginal exam following the transabdominal exam to visualize the adnexa. Color and duplex Doppler ultrasound was utilized to evaluate blood flow to the ovaries. COMPARISON:  CT 01/11/2019, ultrasound 03/26/2018, CT 03/26/2018 FINDINGS: Uterus Measurements: 11.2 x 5.8 x 6.1 cm = volume: 204.5 mL. Multiple large uterine fibroids. Exophytic fundal fibroid measures 6.8 x 6 x 6.5 cm. Pedunculated fibroid off the lower anterior uterine wall measures 6.2 x 4.5 x 5.7 cm. Endometrium Thickness: 6.3 mm.  No focal abnormality visualized. Right ovary Measurements: 2.7 x 1.8 x 2.2 cm = volume: 5.5 mL. Tortuous tubular structure in the right adnexa, felt consistent with hydrosalpinx. Slightly thickened wall and minimal scattered internal echoes. Left ovary Measurements: 5.4 x 2.5 x 3.6 cm = volume: 25.4 mL. Dilated tubular  structure in the left adnexa with slightly thickened wall, felt consistent with hydrosalpinx. Few internal scattered echoes. Posterior to the cervix is a large complex cyst measuring 10.6 x 8.1 x 8.7 cm with flow at the periphery. Cyst contains internal scattered echoes and slightly echogenic debris. Previously this was more homogeneous and measured 8.1 x 8.9 by 7.5 cm. Pulsed Doppler evaluation of both ovaries demonstrates normal low-resistance arterial and venous waveforms. Other  findings No abnormal free fluid. IMPRESSION: 1. Negative for ovarian torsion 2. Bilateral hydrosalpinx with slightly thickened wall and scattered internal echoes which could be secondary to acute inflammation/infection/acute PID. 3. Large 10.6 cm complex cyst posterior to the cervix, associated with left adnexa. Cyst appears more thick walled with less homogeneous internal echoes and is slightly increased in size. Mass remains indeterminate, but could represent tubo-ovarian abscess given inflammatory changes at the adnexa. 4. Large uterine fibroids Electronically Signed   By: Donavan Foil M.D.   On: 01/11/2019 20:10   Dg Chest Port 1 View  Result Date: 01/11/2019 CLINICAL DATA:  Tachycardia with cough and fevers EXAM: PORTABLE CHEST 1 VIEW COMPARISON:  None. FINDINGS: Lungs are clear. Heart size and pulmonary vascularity are normal. No adenopathy. No pneumothorax. No bone lesions. IMPRESSION: No edema or consolidation. Electronically Signed   By: Lowella Grip III M.D.   On: 01/11/2019 13:41   Ct Image Guided Drainage By Percutaneous Catheter  Result Date: 01/14/2019 INDICATION: PID, posterior dependent pelvic abscess EXAM: CT DRAINAGE PELVIC ABSCESS VIA RIGHT TRANS GLUTEAL APPROACH MEDICATIONS: The patient is currently admitted to the hospital and receiving intravenous antibiotics. The antibiotics were administered within an appropriate time frame prior to the initiation of the procedure. ANESTHESIA/SEDATION: Fentanyl 100 mcg IV; Versed 2.0 mg IV, 4 MG ZOFRAN Moderate Sedation Time:  49 MINUTES The patient was continuously monitored during the procedure by the interventional radiology nurse under my direct supervision. COMPLICATIONS: None immediate. PROCEDURE: Informed written consent was obtained from the patient after a thorough discussion of the procedural risks, benefits and alternatives. All questions were addressed. Maximal Sterile Barrier Technique was utilized including caps, mask, sterile  gowns, sterile gloves, sterile drape, hand hygiene and skin antiseptic. A timeout was performed prior to the initiation of the procedure. Previous imaging reviewed. Patient position prone. Noncontrast localization CT performed. The large posterior pelvic abscess was localized. Overlying skin marked for a right trans gluteal approach. Under sterile conditions and local anesthesia, an 18 gauge 10 cm access needle was advanced from a right trans gluteal approach into the fluid collection. Needle position confirmed with CT. Syringe aspiration yielded purulent fluid. Sample sent for culture. Guidewire inserted followed by tract dilatation to insert a 10 [place] drain. Drain catheter position confirmed with CT. Syringe aspiration yielded a total of 260 cc thick purulent fluid. Catheter flushed with saline and connected to external suction bulb. Catheter secured with Prolene suture and a sterile dressing. No immediate complication. Patient tolerated the procedure well. IMPRESSION: Successful CT-guided trans gluteal pelvic abscess drain placement as above Electronically Signed   By: Jerilynn Mages.  Shick M.D.   On: 01/14/2019 15:13    Labs:  CBC: Recent Labs    01/12/19 0231 01/13/19 0458 01/14/19 0729 01/15/19 0048  WBC 22.3* 18.9* 12.1* 10.8*  HGB 12.6 11.6* 13.0 12.5  HCT 39.4 36.7 39.9 39.3  PLT 268 247 250 295    COAGS: Recent Labs    01/14/19 0729  INR 1.1    BMP: Recent Labs    01/12/19 0231 01/13/19 0458  01/14/19 0729 01/15/19 0048  NA 137 136 137 137  K 3.7 3.1* 3.9 3.6  CL 102 103 104 105  CO2 22 24 22 23   GLUCOSE 73 87 94 94  BUN 5* <5* <5* <5*  CALCIUM 8.2* 7.8* 8.2* 8.3*  CREATININE 0.92 0.88 0.86 0.86  GFRNONAA >60 >60 >60 >60  GFRAA >60 >60 >60 >60    LIVER FUNCTION TESTS: Recent Labs    01/11/19 1324 01/12/19 0231 01/13/19 0458 01/15/19 0048  BILITOT 0.7 0.7 1.0 0.6  AST 13* 13* 11* 12*  ALT 11 6 10 8   ALKPHOS 72 87 67 67  PROT 7.1 6.5 6.3* 6.7  ALBUMIN 3.5 3.2*  2.8* 2.9*    Assessment and Plan:  33 y/o F s/p pelvic abscess drain placement via right TG approach 01/14/19 by Dr. Annamaria Boots seen today for follow up prior to discharge home. Patient reports improvement in symptoms, although she is concerned about pain when she returns home - we discussed that pain should be well-controlled with NSAIDs alone and that pain will likely continue to improve as abscess/inflammation improves. We also discussed that we do not typically send patient's home with narcotics for this type of procedure although if she wishes to discuss this further with the admitting provider they may decide otherwise.   Per I/O 260 cc output since placement, approximately 15-20 thin, white liquid in suction bulb on my exam today. The drain has not been flushed since placement per chart. Patient is planned for d/c home today.  Patient will need to follow up with our outpatient clinic in approximately 2 weeks for imaging/possible drain injection - I have placed orders for our schedulers to call her with an appointment date/time and our contact information is in her discharge summary should she have any questions or concerns prior to her appointment. She will need to flush the drain once daily with 5 cc NS, return drain to suction after flushing, change dressing every 2-3 days or earlier if soiled/wet and cover drain with a water tight dressing during showering. Drain care including flushing was discussed at length today by myself  including teach back of drain flushing to which patient displayed excellent understanding and states she feels comfortable caring for the drain at time. She was encouraged to call our office at any time with questions or concerns.  Please call IR with question or concerns.   Electronically Signed: Joaquim Nam, PA-C 01/15/2019, 11:32 AM   I spent a total of 25 Minutes at the the patient's bedside AND on the patient's hospital floor or unit, greater than 50% of  which was counseling/coordinating care for pelvic abscess drain follow up.

## 2019-01-16 LAB — CULTURE, BLOOD (ROUTINE X 2)
Culture: NO GROWTH
Culture: NO GROWTH
Special Requests: ADEQUATE

## 2019-01-18 ENCOUNTER — Other Ambulatory Visit: Payer: Self-pay | Admitting: Obstetrics and Gynecology

## 2019-01-18 DIAGNOSIS — N7093 Salpingitis and oophoritis, unspecified: Secondary | ICD-10-CM

## 2019-01-18 DIAGNOSIS — N730 Acute parametritis and pelvic cellulitis: Secondary | ICD-10-CM

## 2019-01-19 LAB — AEROBIC/ANAEROBIC CULTURE W GRAM STAIN (SURGICAL/DEEP WOUND)
Culture: NO GROWTH
Special Requests: NORMAL

## 2019-01-22 ENCOUNTER — Telehealth: Payer: Self-pay

## 2019-01-22 NOTE — Telephone Encounter (Signed)
Returned call to pt. she was advised to go to the Hospital, c/o halo sized blood clots, heavy bleeding, fulling up pads every hour, weakness, dizziness.

## 2019-01-22 NOTE — Telephone Encounter (Signed)
Pt called stating that she had a procedure and her period and she has been having clots the size of a navel orange with pain.  Per chart review pt concern was addressed by Erick Blinks.

## 2019-01-28 ENCOUNTER — Ambulatory Visit
Admission: RE | Admit: 2019-01-28 | Discharge: 2019-01-28 | Disposition: A | Discharge status: Home / Self Care | Payer: Medicaid Other | Source: Ambulatory Visit | Attending: Physician Assistant | Admitting: Physician Assistant

## 2019-01-28 ENCOUNTER — Ambulatory Visit
Admission: RE | Admit: 2019-01-28 | Discharge: 2019-01-28 | Disposition: A | Discharge status: Home / Self Care | Payer: Medicaid Other | Source: Ambulatory Visit | Attending: Obstetrics and Gynecology | Admitting: Obstetrics and Gynecology

## 2019-01-28 ENCOUNTER — Other Ambulatory Visit: Payer: Self-pay | Admitting: Obstetrics and Gynecology

## 2019-01-28 ENCOUNTER — Ambulatory Visit
Admission: RE | Admit: 2019-01-28 | Discharge: 2019-01-28 | Disposition: A | Discharge status: Home / Self Care | Payer: No Typology Code available for payment source | Source: Ambulatory Visit | Attending: Obstetrics and Gynecology | Admitting: Obstetrics and Gynecology

## 2019-01-28 ENCOUNTER — Encounter: Payer: Self-pay | Admitting: Radiology

## 2019-01-28 DIAGNOSIS — N7093 Salpingitis and oophoritis, unspecified: Secondary | ICD-10-CM

## 2019-01-28 DIAGNOSIS — N730 Acute parametritis and pelvic cellulitis: Secondary | ICD-10-CM

## 2019-01-28 HISTORY — PX: IR RADIOLOGIST EVAL & MGMT: IMG5224

## 2019-01-28 MED ORDER — IOPAMIDOL (ISOVUE-300) INJECTION 61%
100.0000 mL | Freq: Once | INTRAVENOUS | Status: AC | PRN
  Administered 2019-01-28: 100 mL via INTRAVENOUS

## 2019-01-28 NOTE — Progress Notes (Signed)
Interventional Radiology Progress Note    [name] is 33 yo female presenting for a CT and drain injection of a pelvic drain.   She was admitted recently for presumed tubo-ovarian abscess and PID, with drainage performed 10/15.  ~260cc of purulent material aspirated, with sterile result on the micro result.   She has recorded the output of the bulb overtime, with about 1.0- 1.5oz daily over the past ~5days. This is equivalent to 30-45cc daily.    She says she has had "low grade" fevers at home.  Currently not on abx.   CT shows [place] essentially decompression/resolution of the abscess.  Injection under fluoro shows no fistula.  There is cavity filling, but favored only to fill secondary to our injection.    Plan: - I think it is too early to remove the drain, as the output is above 15cc daily.  - We asked her to stop flushing drain.  - Continue to record output daily.  - We will set up repeat appt with CT scan on the same day or day after her next [facility] appointment on November 19th.    Signed,  Dulcy Fanny. Earleen Newport, DO

## 2019-02-01 ENCOUNTER — Other Ambulatory Visit: Payer: Self-pay

## 2019-02-01 ENCOUNTER — Other Ambulatory Visit: Payer: No Typology Code available for payment source

## 2019-02-01 ENCOUNTER — Ambulatory Visit (INDEPENDENT_AMBULATORY_CARE_PROVIDER_SITE_OTHER): Payer: Self-pay | Admitting: Family Medicine

## 2019-02-01 DIAGNOSIS — R350 Frequency of micturition: Secondary | ICD-10-CM

## 2019-02-01 DIAGNOSIS — N7093 Salpingitis and oophoritis, unspecified: Secondary | ICD-10-CM

## 2019-02-01 DIAGNOSIS — L7682 Other postprocedural complications of skin and subcutaneous tissue: Secondary | ICD-10-CM

## 2019-02-01 NOTE — Progress Notes (Signed)
Has OBGYN follow up on 02/18/2019.  States that she fell behind taking her antibiotics so she is still on the Doxycycline & Flagyl. She is concerned about her incision. States that nobody is coming out to clean the area. It looks irritated & is crusted over. States that it is draining yellow pus.

## 2019-02-01 NOTE — Progress Notes (Signed)
Virtual Visit via Telephone Note  I connected with on 02/01/19 at  9:50 AM EST by telephone and verified that I am speaking with the correct person using two identifiers.   I discussed the limitations, risks, security and privacy concerns of performing an evaluation and management service by telephone and the availability of in person appointments. I also discussed with the patient that there may be a patient responsible charge related to this service. The patient expressed understanding and agreed to proceed.  Patient Location:  Home Provider Location: [facility] Others participating in call: none  Hospital Follow-up Where: [facility] When: 01/11/2019 through 01/15/2019 Primary Diagnosis: Acute PID-pelvic inflammatory disease, with tubo-ovarian abscess   History of Present Illness:       33 year old female seen in follow-up of hospitalization from 01/11/2019 through 01/15/2019 for acute PID and tubo-ovarian abscess.  Patient reports that her abdominal/pelvic pain has resolved.  She still has drain in place in the lower abdomen and states that she is starting to have some puslike drainage around the opening to the drain but no tenderness, redness or increased warmth.  She denies any fever or chills.  No nausea or vomiting.  No headaches or dizziness.  She admits that she was not taking her antibiotics on a daily basis as prescribed but over the past few days is now taking the medication as she is supposed to.  She does have follow-up next week with OB/GYN.  On review of systems, she has had some urinary frequency, dysuria and sensation of lower abdominal pressure.  She wonders if she may have a urinary tract infection.           Past Medical History:  Diagnosis Date  . IBS (irritable bowel syndrome)     Past Surgical History:  Procedure Laterality Date  . HERNIA REPAIR    . IR RADIOLOGIST EVAL & MGMT  01/28/2019    Family History  Family history unknown: Yes    Social  History   Tobacco Use  . Smoking status: Former Research scientist (life sciences)  . Smokeless tobacco: Never Used  Substance Use Topics  . Alcohol use: Never    Frequency: Never  . Drug use: Yes    Types: Marijuana     Allergies  Allergen Reactions  . Macadamia Nut Oil Anaphylaxis       Observations/Objective: No vital signs or physical exam conducted as visit was done via telephone  Assessment and Plan: 1. TOA (tubo-ovarian abscess) Patient has been asked to come into the office to have CBC to look for signs of infection/elevated white blood cell count as she reports that she is having some puslike drainage around the drain site and that she has not been taking the antibiotics as prescribed initially after discharge.  She denies any current pelvic or lower abdominal pain.  Patient did have leukocytosis during hospitalization with white blood cell count of 22.3 on 01/12/2019 which had decreased to 10.8 on 01/15/2019.  Patient also initially had mild anemia with hemoglobin of 11.6 on admission and hemoglobin of 12.5 on hospital discharge.  Keep upcoming appointment with GYN and continue to be compliant with antibiotic therapy. - CBC with Differential; Future  2. Urinary frequency Patient with urinary frequency and has been asked to come in to have basic metabolic panel to check electrolytes and renal function as well as urinalysis with reflex culture to look for possible urinary tract infection.  She will be notified of the results of any further treatment is  needed based on these results. - Basic Metabolic Panel; Future - UA/M w/rflx Culture, Routine; Future  Follow Up Instructions: Return for 1 to 2 weeks if not feeling better and as needed.    I discussed the assessment and treatment plan with the patient. The patient was provided an opportunity to ask questions and all were answered. The patient agreed with the plan and demonstrated an understanding of the instructions.   The patient was advised to call  back or seek an in-person evaluation if the symptoms worsen or if the condition fails to improve as anticipated.  I provided 14 minutes of non-face-to-face time during this encounter.   Antony Blackbird, MD

## 2019-02-01 NOTE — Progress Notes (Signed)
Patient here for labs ordered during televisit.

## 2019-02-02 ENCOUNTER — Encounter: Payer: Self-pay | Admitting: Family Medicine

## 2019-02-02 LAB — BASIC METABOLIC PANEL WITH GFR
BUN/Creatinine Ratio: 10 (ref 9–23)
BUN: 9 mg/dL (ref 6–20)
CO2: 25 mmol/L (ref 20–29)
Calcium: 9 mg/dL (ref 8.7–10.2)
Chloride: 102 mmol/L (ref 96–106)
Creatinine, Ser: 0.88 mg/dL (ref 0.57–1.00)
GFR calc Af Amer: 101 mL/min/1.73
GFR calc non Af Amer: 87 mL/min/1.73
Glucose: 70 mg/dL (ref 65–99)
Potassium: 3.9 mmol/L (ref 3.5–5.2)
Sodium: 140 mmol/L (ref 134–144)

## 2019-02-02 LAB — CBC WITH DIFFERENTIAL/PLATELET
Basophils Absolute: 0 x10E3/uL (ref 0.0–0.2)
Basos: 1 %
EOS (ABSOLUTE): 0.2 x10E3/uL (ref 0.0–0.4)
Eos: 3 %
Hematocrit: 38.3 % (ref 34.0–46.6)
Hemoglobin: 12.2 g/dL (ref 11.1–15.9)
Immature Grans (Abs): 0 x10E3/uL (ref 0.0–0.1)
Immature Granulocytes: 0 %
Lymphocytes Absolute: 2.6 x10E3/uL (ref 0.7–3.1)
Lymphs: 42 %
MCH: 26.9 pg (ref 26.6–33.0)
MCHC: 31.9 g/dL (ref 31.5–35.7)
MCV: 85 fL (ref 79–97)
Monocytes Absolute: 0.5 x10E3/uL (ref 0.1–0.9)
Monocytes: 9 %
Neutrophils Absolute: 2.8 x10E3/uL (ref 1.4–7.0)
Neutrophils: 45 %
Platelets: 380 x10E3/uL (ref 150–450)
RBC: 4.53 x10E6/uL (ref 3.77–5.28)
RDW: 13.6 % (ref 11.7–15.4)
WBC: 6.2 x10E3/uL (ref 3.4–10.8)

## 2019-02-02 LAB — MICROSCOPIC EXAMINATION
Casts: NONE SEEN /LPF
Epithelial Cells (non renal): >10 /HPF — AB (ref 0–10)

## 2019-02-02 LAB — UA/M W/RFLX CULTURE, ROUTINE
Bilirubin, UA: NEGATIVE
Glucose, UA: NEGATIVE
Ketones, UA: NEGATIVE
Leukocytes,UA: NEGATIVE
Nitrite, UA: NEGATIVE
Protein,UA: NEGATIVE
RBC, UA: NEGATIVE
Specific Gravity, UA: 1.028 (ref 1.005–1.030)
Urobilinogen, Ur: 0.2 mg/dL (ref 0.2–1.0)
pH, UA: 5 (ref 5.0–7.5)

## 2019-02-17 ENCOUNTER — Telehealth: Payer: Self-pay | Admitting: Obstetrics and Gynecology

## 2019-02-17 NOTE — Telephone Encounter (Signed)
Spoke to patient about her appointment on 11/19 @ 8:15. Patient instructed to wear a face mask for the entire appointment and no visitors are allowed with her during the visit. Patient screened for covid symptoms and denied having any

## 2019-02-18 ENCOUNTER — Ambulatory Visit (INDEPENDENT_AMBULATORY_CARE_PROVIDER_SITE_OTHER): Payer: Self-pay | Admitting: Obstetrics and Gynecology

## 2019-02-18 ENCOUNTER — Encounter: Payer: Self-pay | Admitting: Obstetrics and Gynecology

## 2019-02-18 ENCOUNTER — Ambulatory Visit
Admission: RE | Admit: 2019-02-18 | Discharge: 2019-02-18 | Disposition: A | Discharge status: Home / Self Care | Payer: No Typology Code available for payment source | Source: Ambulatory Visit | Attending: Obstetrics and Gynecology | Admitting: Obstetrics and Gynecology

## 2019-02-18 ENCOUNTER — Other Ambulatory Visit: Payer: Self-pay

## 2019-02-18 VITALS — BP 114/73 | HR 70 | Ht 62.0 in | Wt 155.3 lb

## 2019-02-18 DIAGNOSIS — N7093 Salpingitis and oophoritis, unspecified: Secondary | ICD-10-CM

## 2019-02-18 DIAGNOSIS — N83299 Other ovarian cyst, unspecified side: Secondary | ICD-10-CM

## 2019-02-18 DIAGNOSIS — D219 Benign neoplasm of connective and other soft tissue, unspecified: Secondary | ICD-10-CM

## 2019-02-18 DIAGNOSIS — N730 Acute parametritis and pelvic cellulitis: Secondary | ICD-10-CM

## 2019-02-18 HISTORY — PX: IR RADIOLOGIST EVAL & MGMT: IMG5224

## 2019-02-18 MED ORDER — IOPAMIDOL (ISOVUE-300) INJECTION 61%
100.0000 mL | Freq: Once | INTRAVENOUS | Status: AC | PRN
  Administered 2019-02-18: 14:00:00 100 mL via INTRAVENOUS

## 2019-02-18 NOTE — Progress Notes (Signed)
Referring Physician(s): Ervin,Michael L  Chief Complaint: The patient is seen in follow up today s/p drainage of pelvic/tubo-ovarian abscess on 01/14/2019  History of present illness: Ms. Schroll is a 33 year old female with history of PID/TOA and uterine fibroids who underwent transgluteal pelvic abscess drain placement on 01/14/2019 at [facility].  Drain fluid cultures were negative. She was discharged from the hospital on 01/15/2019.  Outpatient drain injection performed on 01/28/2019 revealed no large residual abscess and no evidence of fistula, however given the significant drain output it was left in place.  She presents again to the IR clinic for follow-up CT and drain evaluation.  She has been doing fairly well since her last visit.  She currently denies fever, headache, chest pain, dyspnea, cough, abdominal pain, nausea, vomiting or bleeding.  She does have some tenderness at drain insertion site right transgluteal region.  She has approximately 25 cc of turbid beige-colored fluid in JP bulb at this time, which is the total amount over the last 4 days.  She stopped flushing her drain on 01/28/2019.  She has completed antibiotics and was seen by her OB/GYN today.    Past Medical History:  Diagnosis Date  . IBS (irritable bowel syndrome)     Past Surgical History:  Procedure Laterality Date  . HERNIA REPAIR    . IR RADIOLOGIST EVAL & MGMT  01/28/2019    Allergies: Macadamia nut oil  Medications: Prior to Admission medications   Not on File     Family History  Family history unknown: Yes    Social History   Socioeconomic History  . Marital status: Significant Other    Spouse name: Not on file  . Number of children: Not on file  . Years of education: Not on file  . Highest education level: Not on file  Occupational History  . Not on file  Social Needs  . Financial resource strain: Not on file  . Food insecurity    Worry: Not on file    Inability: Not on file  .  Transportation needs    Medical: Not on file    Non-medical: Not on file  Tobacco Use  . Smoking status: Former Research scientist (life sciences)  . Smokeless tobacco: Never Used  Substance and Sexual Activity  . Alcohol use: Never    Frequency: Never  . Drug use: Yes    Types: Marijuana  . Sexual activity: Yes    Birth control/protection: None  Lifestyle  . Physical activity    Days per week: Not on file    Minutes per session: Not on file  . Stress: Not on file  Relationships  . Social Herbalist on phone: Not on file    Gets together: Not on file    Attends religious service: Not on file    Active member of club or organization: Not on file    Attends meetings of clubs or organizations: Not on file    Relationship status: Not on file  Other Topics Concern  . Not on file  Social History Narrative   ** Merged History Encounter **         Vital Signs: Vitals:   02/18/19 1300  BP: 117/73  Pulse: 85  SpO2: 90%    LMP 01/20/2019 (Approximate)   Physical Exam awake, alert.  Right transgluteal drain intact, insertion site clean and dry, site mildly tender to palpation.  25 cc of turbid beige-colored fluid in JP bulb.  Imaging: No results found.  Labs:  CBC: Recent Labs    01/13/19 0458 01/14/19 0729 01/15/19 0048 02/01/19 1544  WBC 18.9* 12.1* 10.8* 6.2  HGB 11.6* 13.0 12.5 12.2  HCT 36.7 39.9 39.3 38.3  PLT 247 250 295 380    COAGS: Recent Labs    01/14/19 0729  INR 1.1    BMP: Recent Labs    01/13/19 0458 01/14/19 0729 01/15/19 0048 02/01/19 1544  NA 136 137 137 140  K 3.1* 3.9 3.6 3.9  CL 103 104 105 102  CO2 24 22 23 25   GLUCOSE 87 94 94 70  BUN <5* <5* <5* 9  CALCIUM 7.8* 8.2* 8.3* 9.0  CREATININE 0.88 0.86 0.86 0.88  GFRNONAA >60 >60 >60 87  GFRAA >60 >60 >60 101    LIVER FUNCTION TESTS: Recent Labs    01/11/19 1324 01/12/19 0231 01/13/19 0458 01/15/19 0048  BILITOT 0.7 0.7 1.0 0.6  AST 13* 13* 11* 12*  ALT 11 6 10 8   ALKPHOS 72 87  67 67  PROT 7.1 6.5 6.3* 6.7  ALBUMIN 3.5 3.2* 2.8* 2.9*    Assessment: Ms. Besecker is a 33 year old female with history of PID/TOA and uterine fibroids who underwent transgluteal pelvic abscess drain placement on 01/14/2019 at [facility].  Drain fluid cultures were negative. She was discharged from the hospital on 01/15/2019.  Outpatient drain injection performed on 01/28/2019 revealed no large residual abscess and no evidence of fistula, however given the significant drain output it was left in place. She is currently stable with no fever, worsening abdominal or back pain, nausea/ vomiting.  She has completed antibiotic therapy.  Output from drain over the past 4 days has been approximately 25 cc.  Follow-up CT abdomen/ pelvis today revealed stable positioning of transgluteal drain with resolution of left-sided tubo-ovarian abscess.  There has been interval resolution of the previously noted bilateral hydrosalpinx.  No new drainable fluid collections within the pelvis.  Following discussion with Dr. Pascal Lux decision made to remove drain.  Right transgluteal drain was removed in its entirety without immediate complications.  Gauze dressing applied at the site.  Site care instructions reviewed with patient.  Continue care with Dr. Rip Harbour (OB-GYN) as scheduled.   Signed: D. Rowe Robert, PA-C 02/18/2019, 1:39 PM   Please refer to Dr. Deniece Portela attestation of this note for management and plan.      Patient ID: Kiamber Gretz, female   DOB: 1986-01-29, 33 y.o.   MRN: [id number]

## 2019-02-18 NOTE — Patient Instructions (Signed)
Health Maintenance, Female Adopting a healthy lifestyle and getting preventive care are important in promoting health and wellness. Ask your health care provider about:  The right schedule for you to have regular tests and exams.  Things you can do on your own to prevent diseases and keep yourself healthy. What should I know about diet, weight, and exercise? Eat a healthy diet   Eat a diet that includes plenty of vegetables, fruits, low-fat dairy products, and lean protein.  Do not eat a lot of foods that are high in solid fats, added sugars, or sodium. Maintain a healthy weight Body mass index (BMI) is used to identify weight problems. It estimates body fat based on height and weight. Your health care provider can help determine your BMI and help you achieve or maintain a healthy weight. Get regular exercise Get regular exercise. This is one of the most important things you can do for your health. Most adults should:  Exercise for at least 150 minutes each week. The exercise should increase your heart rate and make you sweat (moderate-intensity exercise).  Do strengthening exercises at least twice a week. This is in addition to the moderate-intensity exercise.  Spend less time sitting. Even light physical activity can be beneficial. Watch cholesterol and blood lipids Have your blood tested for lipids and cholesterol at 32 years of age, then have this test every 5 years. Have your cholesterol levels checked more often if:  Your lipid or cholesterol levels are high.  You are older than 32 years of age.  You are at high risk for heart disease. What should I know about cancer screening? Depending on your health history and family history, you may need to have cancer screening at various ages. This may include screening for:  Breast cancer.  Cervical cancer.  Colorectal cancer.  Skin cancer.  Lung cancer. What should I know about heart disease, diabetes, and high blood  pressure? Blood pressure and heart disease  High blood pressure causes heart disease and increases the risk of stroke. This is more likely to develop in people who have high blood pressure readings, are of African descent, or are overweight.  Have your blood pressure checked: ? Every 3-5 years if you are 18-39 years of age. ? Every year if you are 40 years old or older. Diabetes Have regular diabetes screenings. This checks your fasting blood sugar level. Have the screening done:  Once every three years after age 40 if you are at a normal weight and have a low risk for diabetes.  More often and at a younger age if you are overweight or have a high risk for diabetes. What should I know about preventing infection? Hepatitis B If you have a higher risk for hepatitis B, you should be screened for this virus. Talk with your health care provider to find out if you are at risk for hepatitis B infection. Hepatitis C Testing is recommended for:  Everyone born from 1945 through 1965.  Anyone with known risk factors for hepatitis C. Sexually transmitted infections (STIs)  Get screened for STIs, including gonorrhea and chlamydia, if: ? You are sexually active and are younger than 32 years of age. ? You are older than 32 years of age and your health care provider tells you that you are at risk for this type of infection. ? Your sexual activity has changed since you were last screened, and you are at increased risk for chlamydia or gonorrhea. Ask your health care provider if   you are at risk.  Ask your health care provider about whether you are at high risk for HIV. Your health care provider may recommend a prescription medicine to help prevent HIV infection. If you choose to take medicine to prevent HIV, you should first get tested for HIV. You should then be tested every 3 months for as long as you are taking the medicine. Pregnancy  If you are about to stop having your period (premenopausal) and  you may become pregnant, seek counseling before you get pregnant.  Take 400 to 800 micrograms (mcg) of folic acid every day if you become pregnant.  Ask for birth control (contraception) if you want to prevent pregnancy. Osteoporosis and menopause Osteoporosis is a disease in which the bones lose minerals and strength with aging. This can result in bone fractures. If you are 65 years old or older, or if you are at risk for osteoporosis and fractures, ask your health care provider if you should:  Be screened for bone loss.  Take a calcium or vitamin D supplement to lower your risk of fractures.  Be given hormone replacement therapy (HRT) to treat symptoms of menopause. Follow these instructions at home: Lifestyle  Do not use any products that contain nicotine or tobacco, such as cigarettes, e-cigarettes, and chewing tobacco. If you need help quitting, ask your health care provider.  Do not use street drugs.  Do not share needles.  Ask your health care provider for help if you need support or information about quitting drugs. Alcohol use  Do not drink alcohol if: ? Your health care provider tells you not to drink. ? You are pregnant, may be pregnant, or are planning to become pregnant.  If you drink alcohol: ? Limit how much you use to 0-1 drink a day. ? Limit intake if you are breastfeeding.  Be aware of how much alcohol is in your drink. In the [place] one drink equals one 12 oz bottle of beer (355 mL), one 5 oz glass of wine (148 mL), or one 1 oz glass of hard liquor (44 mL). General instructions  Schedule regular health, dental, and eye exams.  Stay current with your vaccines.  Tell your health care provider if: ? You often feel depressed. ? You have ever been abused or do not feel safe at home. Summary  Adopting a healthy lifestyle and getting preventive care are important in promoting health and wellness.  Follow your health care provider's instructions about healthy  diet, exercising, and getting tested or screened for diseases.  Follow your health care provider's instructions on monitoring your cholesterol and blood pressure. This information is not intended to replace advice given to you by your health care provider. Make sure you discuss any questions you have with your health care provider. Document Released: 10/01/2010 Document Revised: 03/11/2018 Document Reviewed: 03/11/2018 Elsevier Patient Education  2020 Elsevier Inc.  

## 2019-02-18 NOTE — Progress Notes (Signed)
Joanna Smith presents for hospital follow up from PID/TOA and uterine fibroids. Hospitalized from 10/12-10/16. IR placed drain while in hospital. Has follow up appt with them today and hopefully will have drain removed Has completed antibiotics. Denies any bowel or bladder dysfunction  PE AF VSS Lungs clear Heart RRR Abd soft + BS min tenderness, enlarged uterus  A/P TOA/PID        Uterine fibroids        Desire for pregnancy  To see IR today. Pt's biggest desire is pregnancy. Will refer to REI for further eval/Tx/management once released by IR. F/U PRN

## 2019-02-23 ENCOUNTER — Telehealth: Payer: Self-pay

## 2019-02-23 DIAGNOSIS — D219 Benign neoplasm of connective and other soft tissue, unspecified: Secondary | ICD-10-CM

## 2019-02-23 NOTE — Telephone Encounter (Addendum)
-----   Message from Chancy Milroy, MD sent at 02/23/2019  4:41 PM EST ----- Please refer pt to REI for desire of pregnancy in setting of uterine fibroids and H/O PID. Please let pt know about referral Thanks Legrand Como   Notified pt that we have sent a referral to [facility] and that if she has not heard from them in a couple weeks to give them a call. Pt given information to [facility] @ [phone].  [place] office notified to fax request.    Mel Almond, RN

## 2019-03-23 ENCOUNTER — Encounter: Payer: Self-pay | Admitting: *Deleted

## 2019-10-25 ENCOUNTER — Other Ambulatory Visit: Payer: Self-pay

## 2019-10-25 ENCOUNTER — Ambulatory Visit (HOSPITAL_COMMUNITY)
Admission: RE | Admit: 2019-10-25 | Discharge: 2019-10-25 | Disposition: A | Discharge status: Home / Self Care | Payer: BC Managed Care – PPO | Source: Ambulatory Visit | Attending: Family Medicine | Admitting: Family Medicine

## 2019-10-25 ENCOUNTER — Encounter (HOSPITAL_COMMUNITY): Payer: Self-pay

## 2019-10-25 VITALS — BP 135/69 | HR 78 | Temp 98.4°F | Resp 16

## 2019-10-25 DIAGNOSIS — B9789 Other viral agents as the cause of diseases classified elsewhere: Secondary | ICD-10-CM

## 2019-10-25 DIAGNOSIS — R0981 Nasal congestion: Secondary | ICD-10-CM | POA: Diagnosis present

## 2019-10-25 DIAGNOSIS — J988 Other specified respiratory disorders: Secondary | ICD-10-CM | POA: Insufficient documentation

## 2019-10-25 DIAGNOSIS — Z1152 Encounter for screening for COVID-19: Secondary | ICD-10-CM | POA: Diagnosis not present

## 2019-10-25 LAB — SARS CORONAVIRUS 2 (TAT 6-24 HRS): SARS Coronavirus 2: NEGATIVE

## 2019-10-25 MED ORDER — CETIRIZINE HCL 10 MG PO TABS: 10.0000 mg | ORAL_TABLET | Freq: Every day | ORAL | 0 refills | Status: DC

## 2019-10-25 MED ORDER — IPRATROPIUM BROMIDE 0.03 % NA SOLN: 2.0000 | Freq: Three times a day (TID) | NASAL | 0 refills | Status: DC | PRN

## 2019-10-25 NOTE — Discharge Instructions (Addendum)
Your Covid test results will be available within 24 hours or less.  Start Atrovent nasal spray 3 times daily as needed for nasal congestion.  I have also prescribed you cetirizine 10 mg tablet at bedtime this will improve nasal symptoms.

## 2019-10-25 NOTE — ED Provider Notes (Signed)
[facility]    CSN: [id number] Arrival date & time: 10/25/19  1304      History   Chief Complaint Chief Complaint  Patient presents with  . Appointment  . Nasal Congestion  . Laryngitis    HPI Joanna Smith is a 34 y.o. female.   HPI  Patient presents with 2 days of nasal congestion and loss of voice.  She has been taking DayQuil and has not had any relief of symptoms. Patient had initially declined any Covid tested today, however, opted the have test completed given her symptoms once educated regarding the symptoms of Covid.  Patient is unvaccinated.  She is afebrile.  Past Medical History:  Diagnosis Date  . IBS (irritable bowel syndrome)     Patient Active Problem List   Diagnosis Date Noted  . TOA (tubo-ovarian abscess)   . PID (acute pelvic inflammatory disease) 01/11/2019  . Ovarian cyst, complex 04/02/2018  . Fibroids 04/02/2018    Past Surgical History:  Procedure Laterality Date  . HERNIA REPAIR    . IR RADIOLOGIST EVAL & MGMT  01/28/2019  . IR RADIOLOGIST EVAL & MGMT  02/18/2019    OB History   No obstetric history on file.      Home Medications    Prior to Admission medications   Not on File    Family History Family History  Family history unknown: Yes    Social History Social History   Tobacco Use  . Smoking status: Former Research scientist (life sciences)  . Smokeless tobacco: Never Used  Vaping Use  . Vaping Use: Never used  Substance Use Topics  . Alcohol use: Never  . Drug use: Yes    Types: Marijuana    Comment: daily     Allergies   Macadamia nut oil   Review of Systems Review of Systems Pertinent negatives listed in HPI  Physical Exam Triage Vital Signs ED Triage Vitals  Enc Vitals Group     BP 10/25/19 1341 (!) 135/69     Pulse Rate 10/25/19 1341 78     Resp 10/25/19 1341 16     Temp 10/25/19 1341 98.4 F (36.9 C)     Temp Source 10/25/19 1341 Oral     SpO2 10/25/19 1341 99 %     Weight --      Height --      Head  Circumference --      Peak Flow --      Pain Score 10/25/19 1340 0     Pain Loc --      Pain Edu? --      Excl. in [place]? --    No data found.  Updated Vital Signs BP (!) 135/69 (BP Location: Right Arm)   Pulse 78   Temp 98.4 F (36.9 C) (Oral)   Resp 16   LMP 10/04/2019   SpO2 99%   Visual Acuity Right Eye Distance:   Left Eye Distance:   Bilateral Distance:    Right Eye Near:   Left Eye Near:    Bilateral Near:     Physical Exam General appearance: alert, well developed, well nourished, cooperative and in no distress Head: Normocephalic, without obvious abnormality, atraumatic ENT: External ears normal, nares congestion present with erythema, oropharynx normal. Respiratory: Respirations even and unlabored, normal respiratory rate Heart: rate and rhythm normal. No gallop or murmurs noted on exam  Abdomen: BS +, no distention, no rebound tenderness, or no mass Extremities: No gross deformities Skin: Skin color, texture,  turgor normal. No rashes seen  Psych: Appropriate mood and affect.  UC Treatments / Results  Labs (all labs ordered are listed, but only abnormal results are displayed) Labs Reviewed - No data to display  EKG   Radiology No results found.  Procedures Procedures (including critical care time)  Medications Ordered in UC Medications - No data to display  Initial Impression / Assessment and Plan / UC Course  I have reviewed the triage vital signs and the nursing notes.  Pertinent labs & imaging results that were available during my care of the patient were reviewed by me and considered in my medical decision making (see chart for details).    COVID-19 test pending.  Atrovent nasal spray 3 times daily as needed for congestion.  Start cetirizine nightly.  Patient is in need of a primary care provider.  Provided to offices which are currently excepting new patients. An After Visit Summary was printed and given to the patient/family. Precautions  discussed. Red flags discussed. Questions invited and answered. They voiced understanding and agreement.  Final Clinical Impressions(s) / UC Diagnoses   Final diagnoses:  Encounter for screening for COVID-19  Nasal congestion  Viral respiratory illness     Discharge Instructions     Your Covid test results will be available within 24 hours or less.  Start Atrovent nasal spray 3 times daily as needed for nasal congestion.  I have also prescribed you cetirizine 10 mg tablet at bedtime this will improve nasal symptoms.    ED Prescriptions    Medication Sig Dispense Auth. Provider   ipratropium (ATROVENT) 0.03 % nasal spray Place 2 sprays into both nostrils 3 (three) times daily as needed for rhinitis. 30 mL Scot Jun, FNP   cetirizine (ZYRTEC) 10 MG tablet Take 1 tablet (10 mg total) by mouth daily. 30 tablet Scot Jun, FNP     PDMP not reviewed this encounter.   Scot Jun, FNP 10/25/19 1410

## 2019-10-25 NOTE — ED Triage Notes (Addendum)
Patient presents to urgent care today with symptoms of nasal congestion and laryngitis after sleeping under a fan. Symptoms began on 2 days ago. They have tried Dayquil with some relief of symptoms. Pt declines covid testing.

## 2021-01-26 IMAGING — CT CT ABD-PELV W/ CM
2 of 4 series · 15 of 46 positions shown, 17 images · IV contrast (omnipaque)
Comparison: 03/26/2018

CLINICAL DATA: Acute onset abdominal pain beginning last night.
Suspected appendicitis.

EXAM:
CT ABDOMEN AND PELVIS WITH CONTRAST
TECHNIQUE: Multidetector CT imaging of the abdomen and pelvis was performed
using the standard protocol following bolus administration of
intravenous contrast.
CONTRAST:  100mL OMNIPAQUE IOHEXOL 300 MG/ML SOLN, <See Chart>
OMNIPAQUE IOHEXOL 300 MG/ML SOLN

[Series 3: a/p w/ 5mm · axial · 0.64mm/px · z∈[+760,+1200]mm · 12 of 97 slices shown, 14 images]
[im 5/97  soft-tissue]
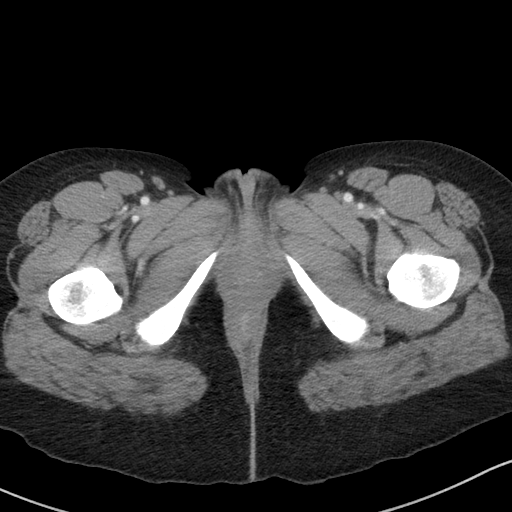
[im 5/97  bone]
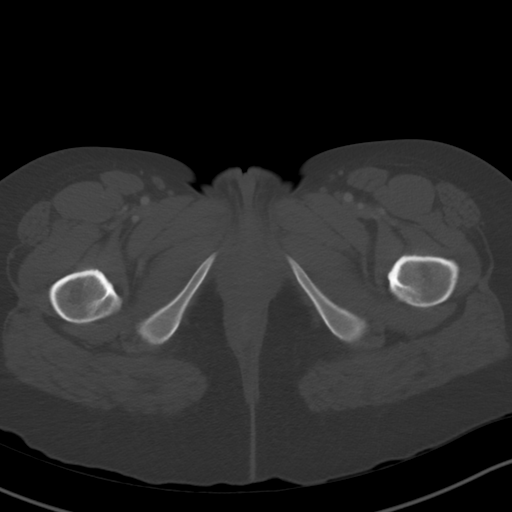
[im 13/97  soft-tissue]
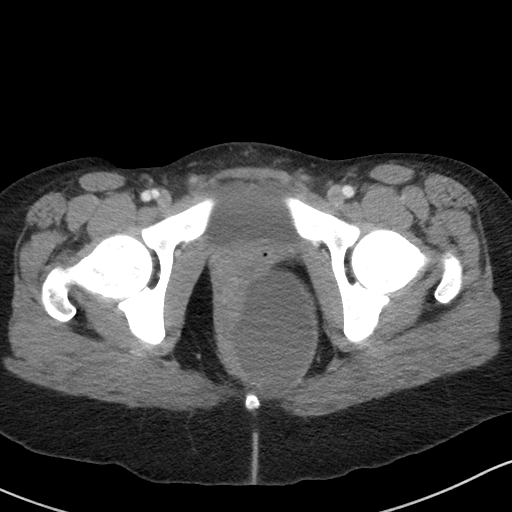
[im 21/97  soft-tissue]
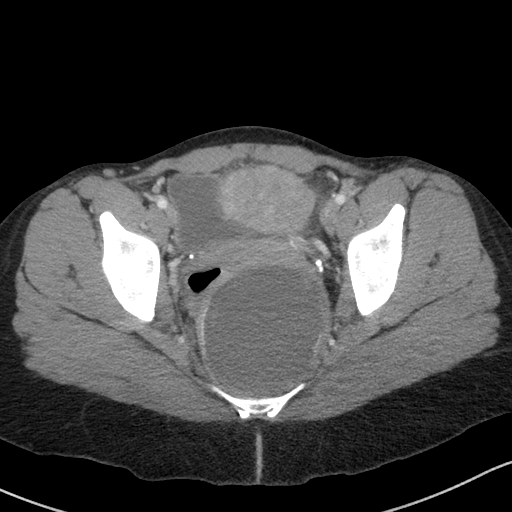
[im 29/97  soft-tissue]
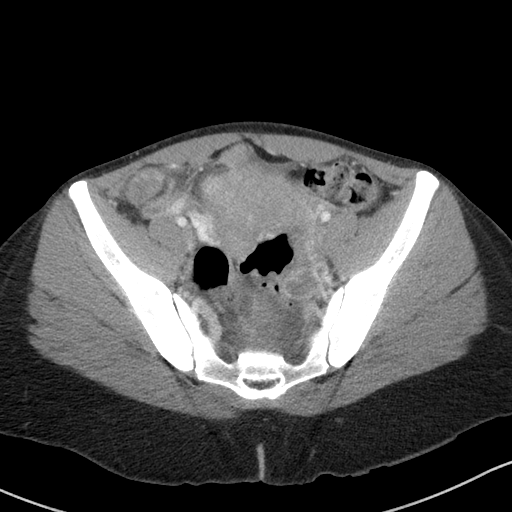
[im 37/97  soft-tissue]
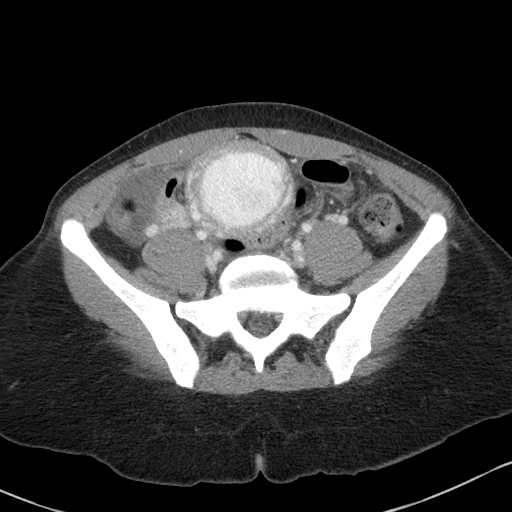
[im 45/97  soft-tissue]
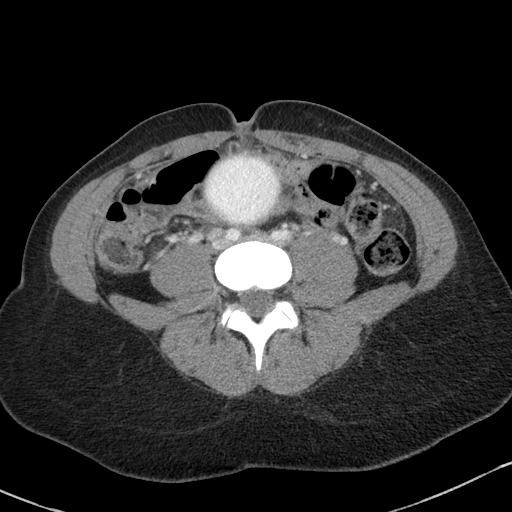
[im 53/97  soft-tissue]
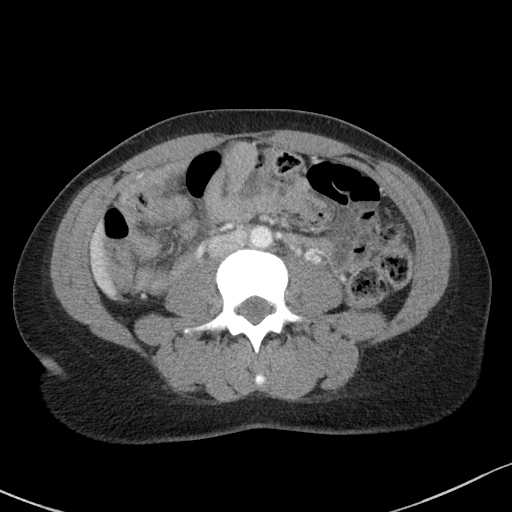
[im 61/97  soft-tissue]
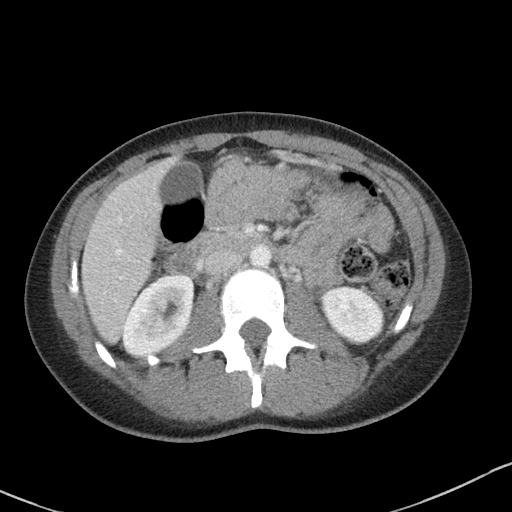
[im 69/97  soft-tissue]
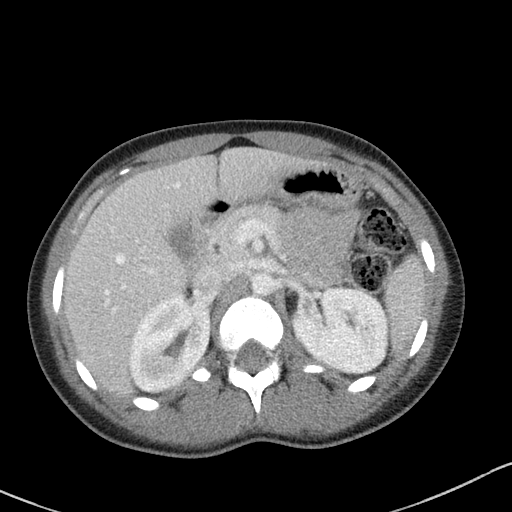
[im 69/97  bone]
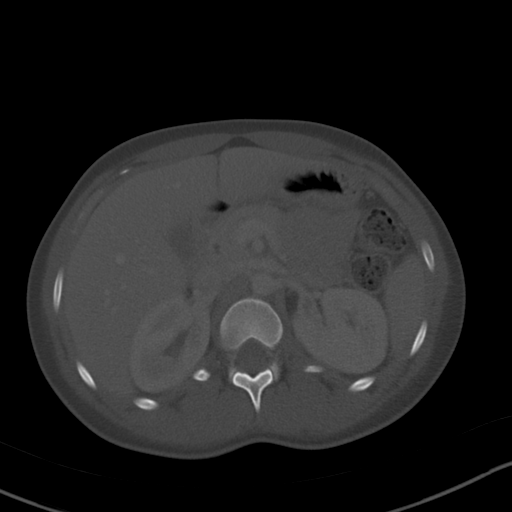
[im 77/97  soft-tissue]
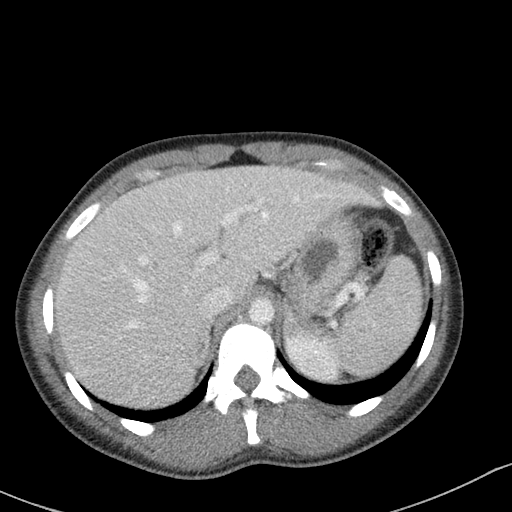
[im 85/97  soft-tissue]
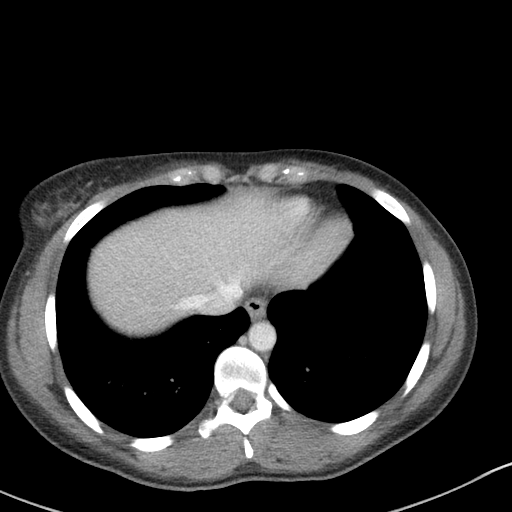
[im 93/97  soft-tissue]
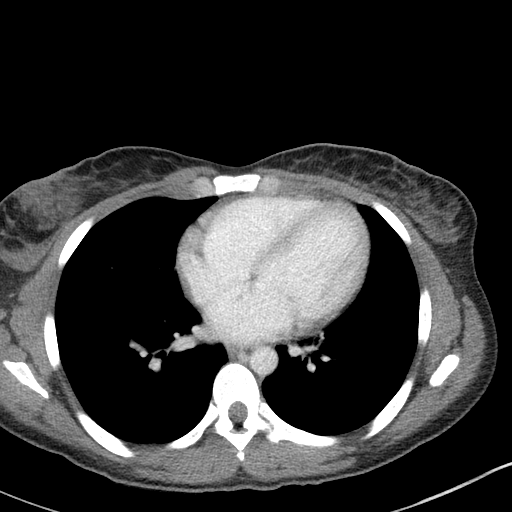

[Series 6: a/p w/ cor · coronal · 0.64mm/px · 3 of 126 slices shown]
[im 42/126  soft-tissue]
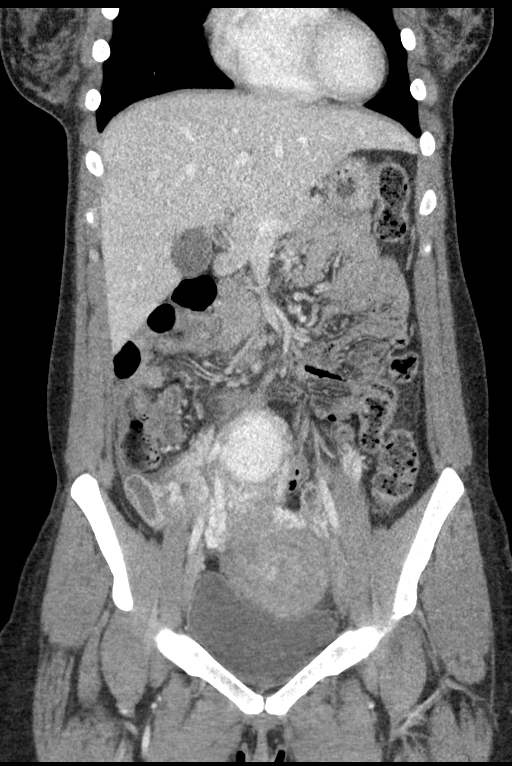
[im 56/126  soft-tissue]
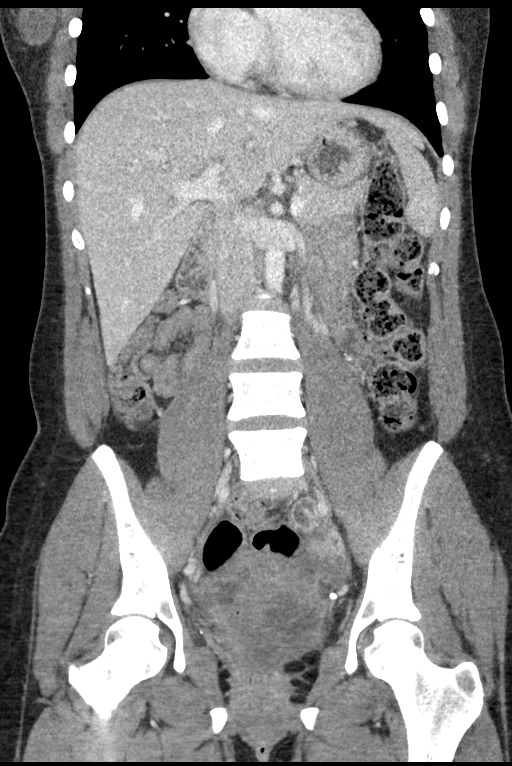
[im 70/126  soft-tissue]
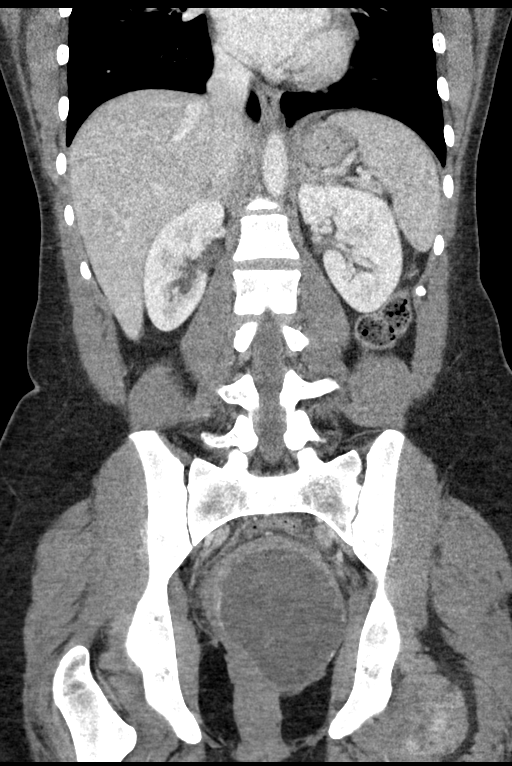

[15 of 46 positions shown; findings below may reference images not displayed]

FINDINGS: Lower Chest: No acute findings.

Hepatobiliary: No hepatic masses identified. Gallbladder is
unremarkable. No evidence of biliary ductal dilatation.

Pancreas:  No mass or inflammatory changes.

Spleen: Within normal limits in size and appearance.

Adrenals/Urinary Tract: No masses identified. No evidence of
hydronephrosis.

Stomach/Bowel: No evidence of obstruction, inflammatory process or
abnormal fluid collections. Normal appendix visualized.

Vascular/Lymphatic: No pathologically enlarged lymph nodes. No
abdominal aortic aneurysm.

Reproductive: Several uterine fibroids are again seen. Largest in
the anterior lower uterine segment measures 9 cm in maximum
diameter. Another fibroid in the fundus measures 6 cm and shows
diffuse hypervascular enhancement.

Right-sided hydrosalpinx shows mild increase in size, with increased
mural enhancement and mild adjacent inflammatory changes, consistent
with tubo-ovarian abscess. Left hydrosalpinx shows no significant
change. A complex cystic lesion which contains several enhancing
internal septations is again seen in the pelvic cul-de-sac,
currently measuring 9.5 x 7.7 cm compared to 9.2 x 7.9 cm
previously. This likely represents a chronic left tubo-ovarian
abscess, with cystic neoplasm of the left ovary considered less
likely. No evidence of free fluid.

Other:  None.

Musculoskeletal:  No suspicious bone lesions identified.
IMPRESSION: Mild increase in size of right hydrosalpinx with mild adjacent
inflammatory changes, consistent with acute pelvic inflammatory
disease.

Stable left hydrosalpinx and 9.5 cm complex cystic lesion in pelvic
cul-de-sac, likely representing chronic left tubo-ovarian abscess,
with cystic neoplasm of left ovary considered less likely.

Multiple uterine fibroids.

No evidence of appendicitis.

## 2021-01-29 IMAGING — CT CT IMAGE GUIDED DRAINAGE BY PERCUTANEOUS CATHETER
1 of 2 series · 15 of 32 positions shown, 19 images · non-contrast
Comparison: none

INDICATION: PID, posterior dependent pelvic abscess

[Series 2: i-spiral 5.0 b40f · axial · 0.94mm/px · z∈[+884,+1136]mm · 15 of 80 slices shown, 19 images]
[im 4/80  soft-tissue]
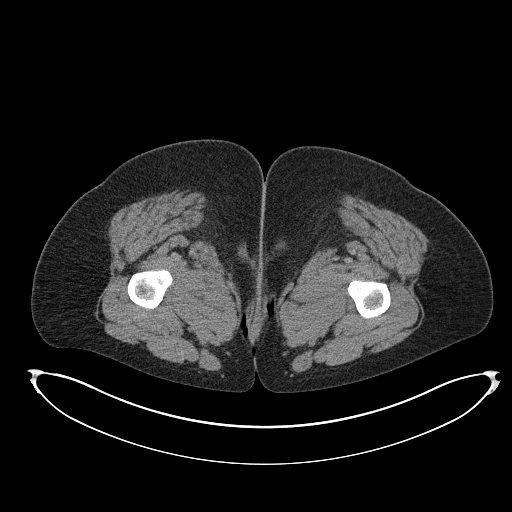
[im 4/80  bone]
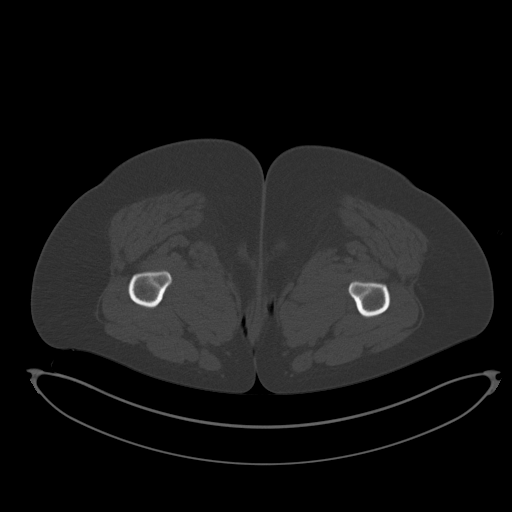
[im 10/80  soft-tissue]
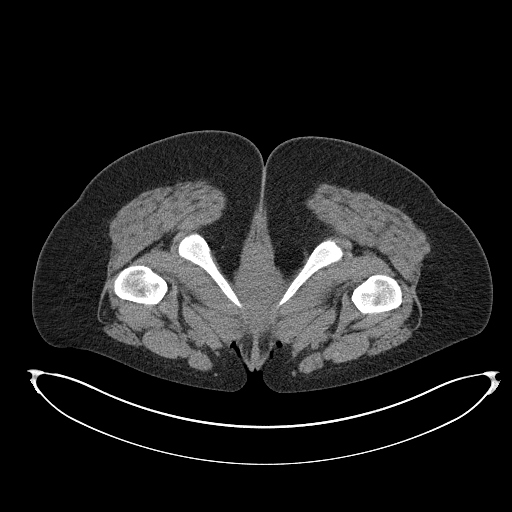
[im 16/80  soft-tissue]
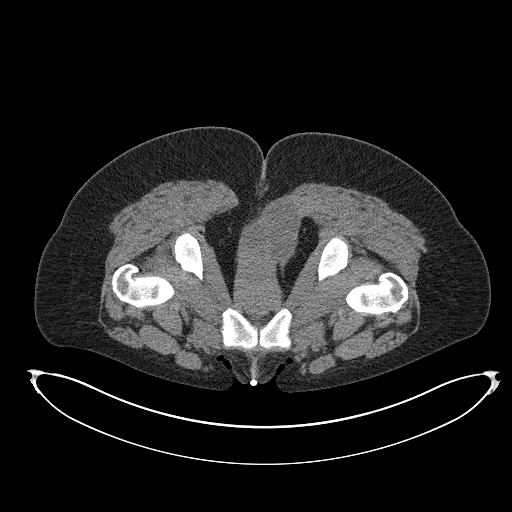
[im 22/80  soft-tissue]
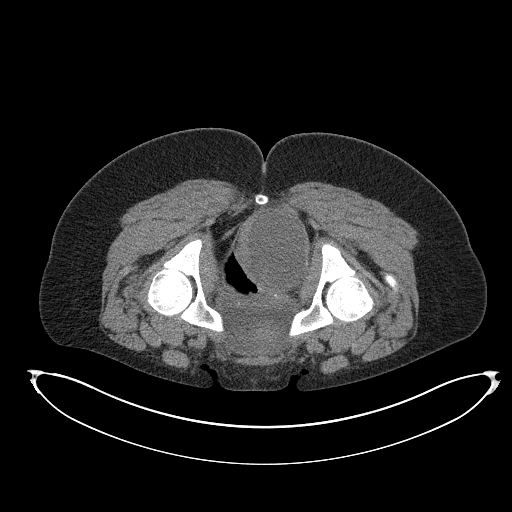
[im 28/80  soft-tissue]
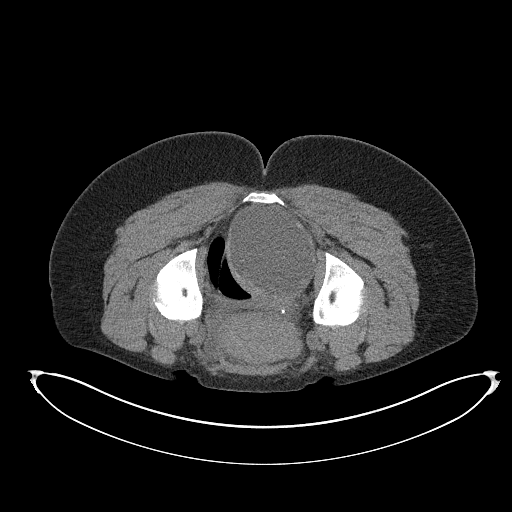
[im 34/80  soft-tissue]
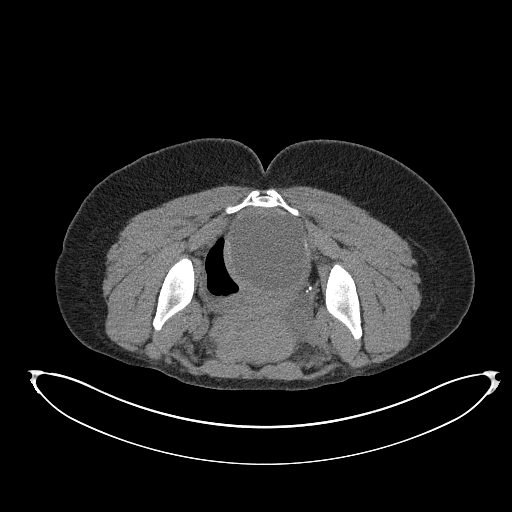
[im 40/80  soft-tissue]
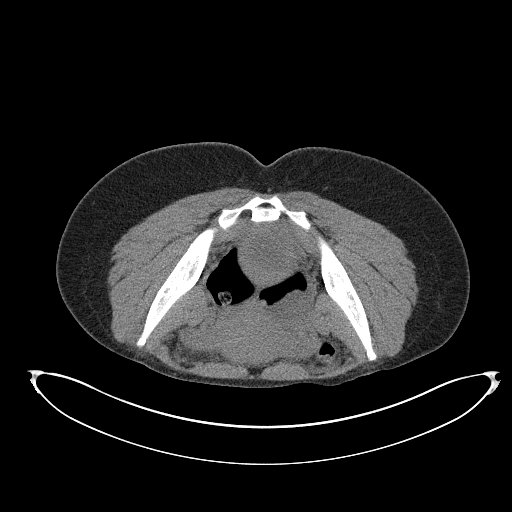
[im 46/80  soft-tissue]
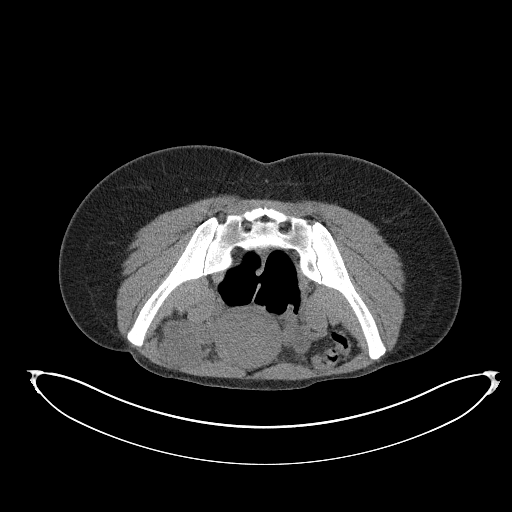
[im 52/80  soft-tissue]
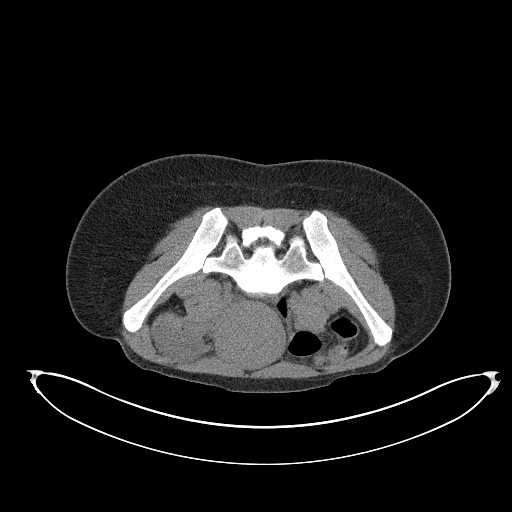
[im 52/80  bone]
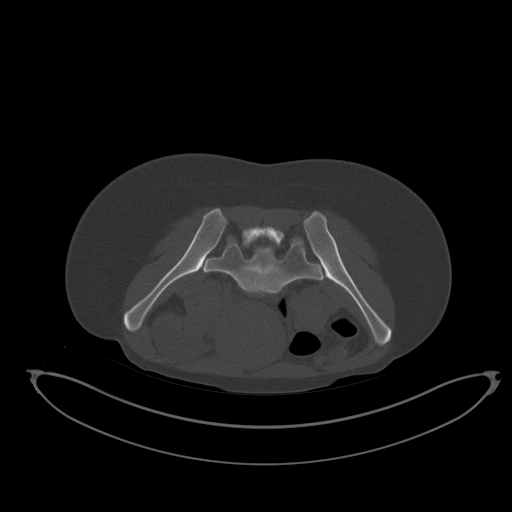
[im 58/80  soft-tissue]
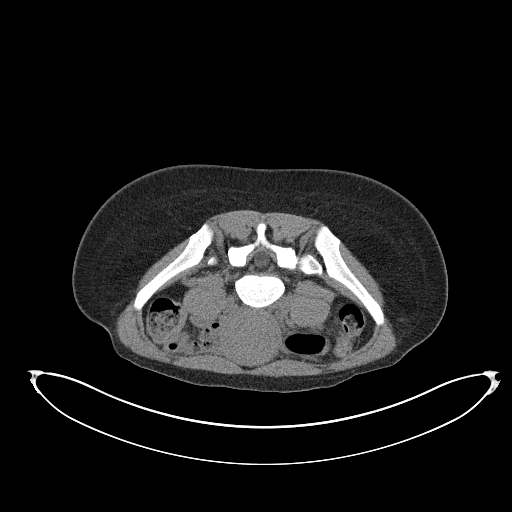
[im 64/80  soft-tissue]
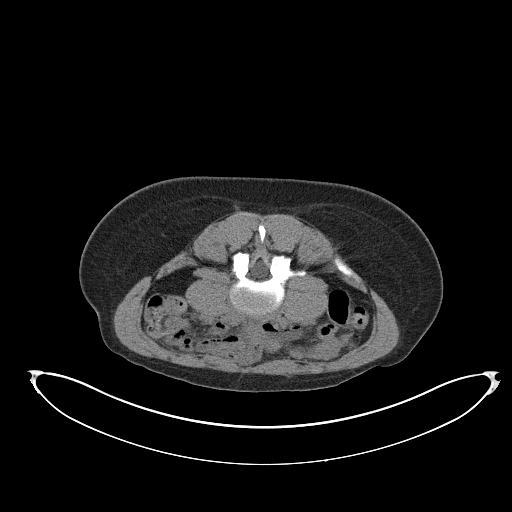
[im 67/80  lung]
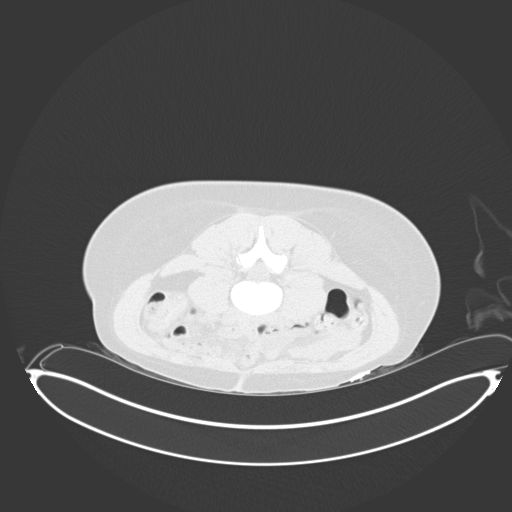
[im 70/80  soft-tissue]
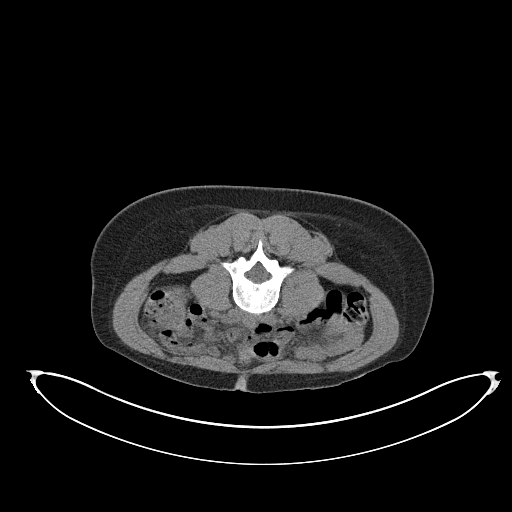
[im 70/80  lung]
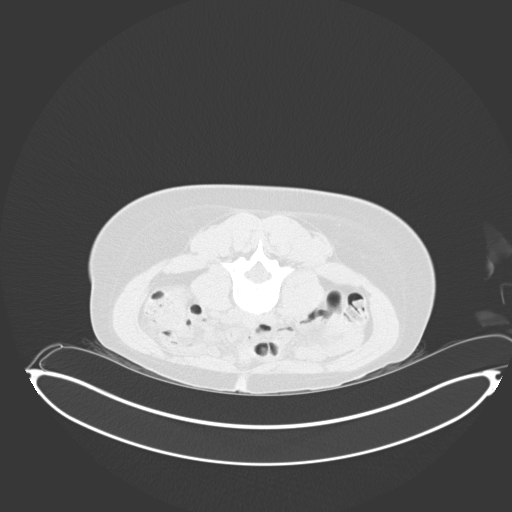
[im 73/80  lung]
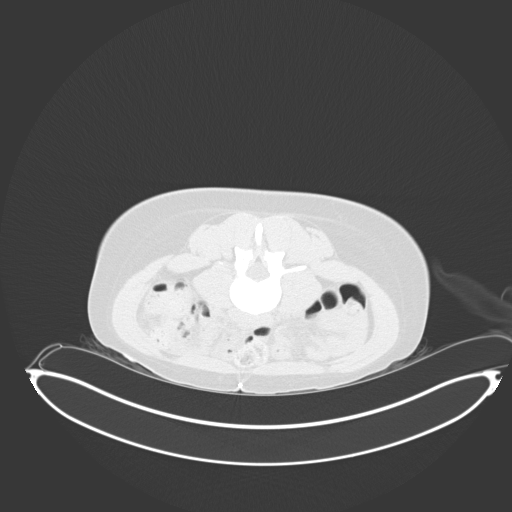
[im 76/80  soft-tissue]
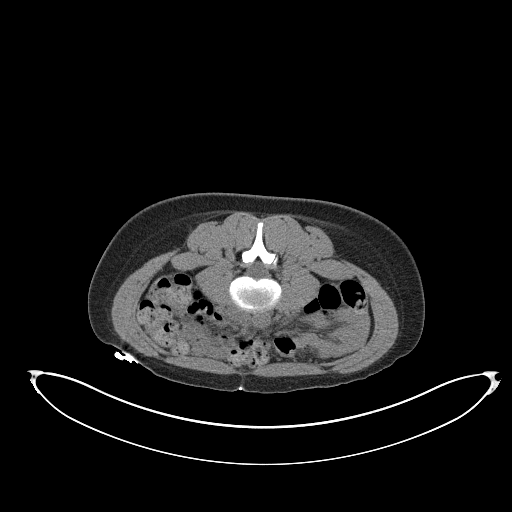
[im 76/80  lung]
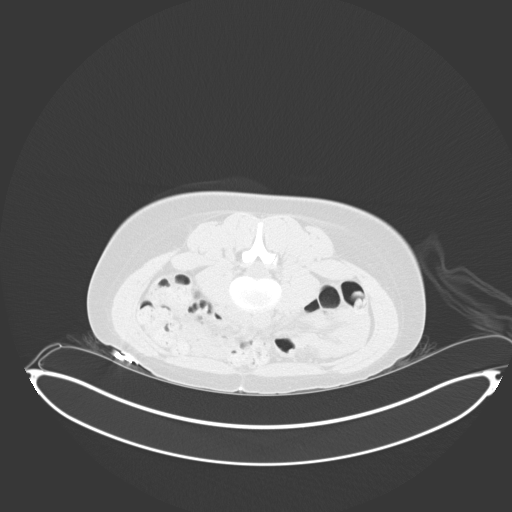

[15 of 32 positions shown; findings below may reference images not displayed]

EXAM:
CT DRAINAGE PELVIC ABSCESS VIA RIGHT TRANS GLUTEAL APPROACH

MEDICATIONS:
The patient is currently admitted to the hospital and receiving
intravenous antibiotics. The antibiotics were administered within an
appropriate time frame prior to the initiation of the procedure.

ANESTHESIA/SEDATION:
Fentanyl 100 mcg IV; Versed 2.0 mg IV, 4 MG ZOFRAN

Moderate Sedation Time:  19 MINUTES

The patient was continuously monitored during the procedure by the
interventional radiology nurse under my direct supervision.

COMPLICATIONS:
None immediate.

PROCEDURE:
Informed written consent was obtained from the patient after a
thorough discussion of the procedural risks, benefits and
alternatives. All questions were addressed. Maximal Sterile Barrier
Technique was utilized including caps, mask, sterile gowns, sterile
gloves, sterile drape, hand hygiene and skin antiseptic. A timeout
was performed prior to the initiation of the procedure.

Previous imaging reviewed. Patient position prone. Noncontrast
localization CT performed. The large posterior pelvic abscess was
localized. Overlying skin marked for a right trans gluteal approach.

Under sterile conditions and local anesthesia, an 18 gauge 10 cm
access needle was advanced from a right trans gluteal approach into
the fluid collection. Needle position confirmed with CT. Syringe
aspiration yielded purulent fluid. Sample sent for culture.
Guidewire inserted followed by tract dilatation to insert a 10
French drain. Drain catheter position confirmed with CT. Syringe
aspiration yielded a total of 260 cc thick purulent fluid. Catheter
flushed with saline and connected to external suction bulb. Catheter
secured with Prolene suture and a sterile dressing. No immediate
complication. Patient tolerated the procedure well.
IMPRESSION: Successful CT-guided trans gluteal pelvic abscess drain placement as
above

## 2021-07-17 ENCOUNTER — Other Ambulatory Visit: Payer: Self-pay

## 2021-07-17 ENCOUNTER — Emergency Department (HOSPITAL_COMMUNITY)
Admission: EM | Admit: 2021-07-17 | Discharge: 2021-07-18 | Disposition: A | Discharge status: Home / Self Care | Payer: BC Managed Care – PPO | Attending: Emergency Medicine | Admitting: Emergency Medicine

## 2021-07-17 DIAGNOSIS — B9689 Other specified bacterial agents as the cause of diseases classified elsewhere: Secondary | ICD-10-CM | POA: Diagnosis not present

## 2021-07-17 DIAGNOSIS — N760 Acute vaginitis: Secondary | ICD-10-CM | POA: Diagnosis not present

## 2021-07-17 DIAGNOSIS — R1030 Lower abdominal pain, unspecified: Secondary | ICD-10-CM | POA: Diagnosis present

## 2021-07-17 DIAGNOSIS — D219 Benign neoplasm of connective and other soft tissue, unspecified: Secondary | ICD-10-CM | POA: Insufficient documentation

## 2021-07-17 LAB — COMPREHENSIVE METABOLIC PANEL
ALT: 11 U/L (ref 0–44)
AST: 14 U/L — ABNORMAL LOW (ref 15–41)
Albumin: 4 g/dL (ref 3.5–5.0)
Alkaline Phosphatase: 62 U/L (ref 38–126)
Anion gap: 8 (ref 5–15)
BUN: 6 mg/dL (ref 6–20)
CO2: 24 mmol/L (ref 22–32)
Calcium: 8.8 mg/dL — ABNORMAL LOW (ref 8.9–10.3)
Chloride: 104 mmol/L (ref 98–111)
Creatinine, Ser: 0.83 mg/dL (ref 0.44–1.00)
GFR, Estimated: >60 mL/min (ref >60)
Glucose, Bld: 93 mg/dL (ref 70–99)
Potassium: 3.7 mmol/L (ref 3.5–5.1)
Sodium: 136 mmol/L (ref 135–145)
Total Bilirubin: 0.7 mg/dL (ref 0.3–1.2)
Total Protein: 7.4 g/dL (ref 6.5–8.1)

## 2021-07-17 LAB — CBC WITH DIFFERENTIAL/PLATELET
Abs Immature Granulocytes: 0.04 10*3/uL (ref 0.00–0.07)
Basophils Absolute: 0 10*3/uL (ref 0.0–0.1)
Basophils Relative: 1 %
Eosinophils Absolute: 0 10*3/uL (ref 0.0–0.5)
Eosinophils Relative: 1 %
HCT: 44.6 % (ref 36.0–46.0)
Hemoglobin: 14.5 g/dL (ref 12.0–15.0)
Immature Granulocytes: 1 %
Lymphocytes Relative: 19 %
Lymphs Abs: 1.4 10*3/uL (ref 0.7–4.0)
MCH: 28.3 pg (ref 26.0–34.0)
MCHC: 32.5 g/dL (ref 30.0–36.0)
MCV: 86.9 fL (ref 80.0–100.0)
Monocytes Absolute: 1.1 10*3/uL — ABNORMAL HIGH (ref 0.1–1.0)
Monocytes Relative: 14 %
Neutro Abs: 4.8 10*3/uL (ref 1.7–7.7)
Neutrophils Relative %: 64 %
Platelets: 192 10*3/uL (ref 150–400)
RBC: 5.13 MIL/uL — ABNORMAL HIGH (ref 3.87–5.11)
RDW: 13.9 % (ref 11.5–15.5)
WBC: 7.4 10*3/uL (ref 4.0–10.5)
nRBC: 0 % (ref 0.0–0.2)

## 2021-07-17 LAB — I-STAT BETA HCG BLOOD, ED (MC, WL, AP ONLY): I-stat hCG, quantitative: <5 m[IU]/mL (ref <5)

## 2021-07-17 LAB — LIPASE, BLOOD: Lipase: 31 U/L (ref 11–51)

## 2021-07-17 NOTE — ED Triage Notes (Signed)
Pt reports feeling a softball size mass in her lower abdomen x 1 week. Endorses intermittent pain and nausea without vomiting.  ?

## 2021-07-17 NOTE — ED Provider Triage Note (Signed)
Emergency Medicine Provider Triage Evaluation Note ? ?Joanna Smith , a 36 y.o. female  was evaluated in triage.  Pt complains of worsening abdominal pain for the last week.  Fever began last night.  Intermittent nausea but no vomiting.  Endorses mild constipation.  Denies diarrhea, shortness of breath, chest pain.  LMP 2 weeks ago.  No intercourse since then.  Denies chance of pregnancy.  Abdominal pain worst in the suprapubic area.  Denies urinary symptoms.  Hx of TOA.  No Hx of abdominal surgeries. ? ?Review of Systems  ?Positive: As above ?Negative: As above ? ?Physical Exam  ?BP (!) 142/95 (BP Location: Right Arm)   Pulse 85   Temp 98.5 ?F (36.9 ?C) (Oral)   Resp 20   LMP 06/20/2021 (Exact Date)   SpO2 97%  ?Gen:   Awake, no distress   ?Resp:  Normal effort CTAB ?MSK:   Moves extremities without difficulty  ?Other:  Diffuse abdominal pain, worst over the suprapubic region ? ?Medical Decision Making  ?Medically screening exam initiated at 5:22 PM.  Appropriate orders placed.  [name] was informed that the remainder of the evaluation will be completed by another provider, this initial triage assessment does not replace that evaluation, and the importance of remaining in the ED until their evaluation is complete. ? ?Labs ordered.  Offered Zofran, patient says she is not actively nauseous. ?  ?Prince Rome, PA-C ?72/53/66 1734 ? ?

## 2021-07-17 NOTE — ED Notes (Signed)
Talk with patient and explain the process of the ED,patient has clear understanding on why she is waiting. ?

## 2021-07-18 ENCOUNTER — Emergency Department (HOSPITAL_COMMUNITY): Payer: BC Managed Care – PPO

## 2021-07-18 ENCOUNTER — Encounter (HOSPITAL_COMMUNITY): Payer: Self-pay | Admitting: Emergency Medicine

## 2021-07-18 LAB — URINALYSIS, ROUTINE W REFLEX MICROSCOPIC
Bilirubin Urine: NEGATIVE
Glucose, UA: NEGATIVE mg/dL
Hgb urine dipstick: NEGATIVE
Ketones, ur: 80 mg/dL — AB
Leukocytes,Ua: NEGATIVE
Nitrite: NEGATIVE
Protein, ur: 30 mg/dL — AB
Specific Gravity, Urine: 1.025 (ref 1.005–1.030)
pH: 5 (ref 5.0–8.0)

## 2021-07-18 LAB — WET PREP, GENITAL
Sperm: NONE SEEN
Trich, Wet Prep: NONE SEEN
WBC, Wet Prep HPF POC: <10 (ref <10)
Yeast Wet Prep HPF POC: NONE SEEN

## 2021-07-18 LAB — PREGNANCY, URINE: Preg Test, Ur: NEGATIVE

## 2021-07-18 MED ORDER — IOHEXOL 300 MG/ML  SOLN
100.0000 mL | Freq: Once | INTRAMUSCULAR | Status: AC | PRN
  Administered 2021-07-18: 100 mL via INTRAVENOUS

## 2021-07-18 MED ORDER — FLUCONAZOLE 150 MG PO TABS: 150.0000 mg | ORAL_TABLET | Freq: Once | ORAL | 0 refills | Status: AC

## 2021-07-18 MED ORDER — ONDANSETRON HCL 4 MG PO TABS: 4.0000 mg | ORAL_TABLET | ORAL | 0 refills | Status: DC | PRN

## 2021-07-18 MED ORDER — METRONIDAZOLE 500 MG PO TABS: 500.0000 mg | ORAL_TABLET | Freq: Two times a day (BID) | ORAL | 0 refills | Status: DC

## 2021-07-18 MED ORDER — GADOBUTROL 1 MMOL/ML IV SOLN
7.5000 mL | Freq: Once | INTRAVENOUS | Status: AC | PRN
  Administered 2021-07-18: 7.5 mL via INTRAVENOUS

## 2021-07-18 MED ORDER — DOXYCYCLINE HYCLATE 100 MG PO CAPS: 100.0000 mg | ORAL_CAPSULE | Freq: Two times a day (BID) | ORAL | 0 refills | Status: DC

## 2021-07-18 MED ORDER — ACETAMINOPHEN 500 MG PO TABS
1000.0000 mg | ORAL_TABLET | Freq: Once | ORAL | Status: AC
  Administered 2021-07-18: 1000 mg via ORAL
  Filled 2021-07-18: qty 2

## 2021-07-18 MED ORDER — IBUPROFEN 800 MG PO TABS
800.0000 mg | ORAL_TABLET | Freq: Once | ORAL | Status: AC
  Administered 2021-07-18: 800 mg via ORAL
  Filled 2021-07-18: qty 1

## 2021-07-18 NOTE — ED Notes (Signed)
Pt to MRI

## 2021-07-18 NOTE — Discharge Instructions (Addendum)
It was a pleasure caring for you today in the emergency department. ? ?Please take OTC Motrin or Tylenol as needed for abdominal discomfort ? ?Please return to the emergency department for any worsening or worrisome symptoms. ? ?

## 2021-07-18 NOTE — ED Provider Notes (Signed)
?  Provider Note ?MRN:  [id number]  ?Arrival date & time: 07/18/21    ?ED Course and Medical Decision Making  ?Assumed care from [place] at shift change. ? ?See not from prior team for complete details, in brief: 36 yo female with cystic structure to ovary, overnight EDP spoke with GYN who requested pelvic MRI. Pt is not septic, she is HDS. Pain controlled.  ? ?Overnight EDP spoke with Dr Elly Modena OBGYN who rec'd MRI ? ? ?Plan per prior physician MRI then d/w obgyn ? ?MRI complete, not crossing over ?"IMPRESSION: ?1. Complex appearing multiloculated cystic structure in the ?posterior pelvis measures approximally 7.1 x 6.2 cm demonstrating a thickened rind but no suspicious postcontrast enhancement, likely corresponding the previously drained fluid-filled lesion seen on prior imaging. Notably there is no edema adjacent to this cystic structure and no evidence of connection with left reproductive ?structures. 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 ? ?2. Leiomyomatous uterus including a pedunculated uterine fibroid which demonstrates a thin stalk, with this morphology susceptible to torsion of the fibroid which can be a cause of pelvic pain." ? ? ? ?I personally viewed MRI images and agree with radiology interpretation.  ? ? ?Will d/w OBGYN - Spoke with Dr Alwyn Pea, no acute surgical intervention.  No TOA.  Recommend starting doxycycline and follow-up in the office in 1 to 2 weeks. ? ?Patient also was found to have bacterial vaginosis.  Start Flagyl ? ?Pain well controlled with motrin / apap, tolerating PO. Abd is soft, non-tender, non-peritoneal. ? ?The patient improved significantly and was discharged in stable condition. Detailed discussions were had with the patient regarding current findings, and need for close f/u with PCP or on call doctor. The  patient has been instructed to return immediately if the symptoms worsen in any way for re-evaluation. Patient verbalized understanding and is in agreement with current care plan. All questions answered prior to discharge. ? ? ? ?Procedures ? ?Final Clinical Impressions(s) / ED Diagnoses  ? ?  ICD-10-CM   ?1. Fibroids  D21.9   ?  ?2. Bacterial vaginosis  N76.0   ? B96.89   ?  ?  ?ED Discharge Orders   ? ?      Ordered  ?  metroNIDAZOLE (FLAGYL) 500 MG tablet  2 times daily       ? 07/18/21 1515  ?  doxycycline (VIBRAMYCIN) 100 MG capsule  2 times daily       ? 07/18/21 1515  ?  ondansetron (ZOFRAN) 4 MG tablet  Every 4 hours PRN       ? 07/18/21 1526  ?  fluconazole (DIFLUCAN) 150 MG tablet   Once       ? 07/18/21 1540  ? ?  ?  ? ?  ?  ? ? ?Discharge Instructions   ? ?  ?It was a pleasure caring for you today in the emergency department. ? ?Please take OTC Motrin or Tylenol as needed for abdominal discomfort ? ?Please return to the emergency department for any worsening or worrisome symptoms. ? ? ? ? ? ? ? ?  ?Jeanell Sparrow, DO ?07/18/21 1607 ? ?

## 2021-07-18 NOTE — ED Provider Notes (Signed)
?[facility] ?Provider Note ? ? ?CSN: [id number] ?Arrival date & time: 07/17/21  1449 ? ?  ? ?History ? ?Chief Complaint  ?Patient presents with  ? Abdominal Pain  ? ? ?Joanna Smith is a 36 y.o. female. ? ?The history is provided by the patient.  ?Abdominal Pain ?Pain location:  Suprapubic ?Pain quality: aching   ?Pain quality comment:  Mass like swelling ?Pain radiates to:  Does not radiate ?Pain severity:  Moderate ?Onset quality:  Gradual ?Duration:  1 week ?Timing:  Constant ?Progression:  Unchanged ?Chronicity:  New ?Context: not recent travel   ?Relieved by:  Nothing ?Worsened by:  Nothing ?Ineffective treatments:  None tried ?Associated symptoms: no anorexia, no chest pain, no dysuria and no fever   ?Risk factors: no alcohol abuse   ?Patient with h/o fibroids and previous TOA who presents with feeling lower abdominal swelling and pain x 1 week.  No associated symptoms.  She is concerned for recurrence of her previous pelvic abscess.  No urinary complaints.  No fevers.   ?  ? ?Home Medications ?Prior to Admission medications   ?Medication Sig Start Date End Date Taking? Authorizing Provider  ?cetirizine (ZYRTEC) 10 MG tablet Take 1 tablet (10 mg total) by mouth daily. 10/25/19   Scot Jun, FNP  ?ipratropium (ATROVENT) 0.03 % nasal spray Place 2 sprays into both nostrils 3 (three) times daily as needed for rhinitis. 10/25/19   Scot Jun, FNP  ?   ? ?Allergies    ?Macadamia nut oil   ? ?Review of Systems   ?Review of Systems  ?Constitutional:  Negative for fever.  ?HENT:  Negative for facial swelling.   ?Eyes:  Negative for redness.  ?Respiratory:  Negative for stridor.   ?Cardiovascular:  Negative for chest pain.  ?Gastrointestinal:  Positive for abdominal pain. Negative for anorexia.  ?Genitourinary:  Negative for dysuria and flank pain.  ?Musculoskeletal:  Negative for neck stiffness.  ?Skin:  Negative for rash.  ?Neurological:  Negative for facial  asymmetry.  ?All other systems reviewed and are negative. ? ?Physical Exam ?Updated Vital Signs ?BP (!) 141/97 (BP Location: Left Arm)   Pulse 83   Temp 98.2 ?F (36.8 ?C) (Oral)   Resp 17   LMP 06/20/2021 (Exact Date)   SpO2 100%  ?Physical Exam ?Vitals and nursing note reviewed.  ?Constitutional:   ?   General: She is not in acute distress. ?   Appearance: Normal appearance.  ?HENT:  ?   Head: Normocephalic and atraumatic.  ?   Nose: Nose normal.  ?Eyes:  ?   Conjunctiva/sclera: Conjunctivae normal.  ?   Pupils: Pupils are equal, round, and reactive to light.  ?Cardiovascular:  ?   Rate and Rhythm: Normal rate and regular rhythm.  ?   Pulses: Normal pulses.  ?   Heart sounds: Normal heart sounds.  ?Pulmonary:  ?   Effort: Pulmonary effort is normal.  ?   Breath sounds: Normal breath sounds.  ?Abdominal:  ?   General: Bowel sounds are normal.  ?   Palpations: Abdomen is soft.  ?   Tenderness: There is no abdominal tenderness. There is no guarding or rebound.  ?   Hernia: No hernia is present.  ?   Comments: Fibroid palpable on abdominal exam.  No fluctuance of the abdominal wall   ?Musculoskeletal:     ?   General: Normal range of motion.  ?   Cervical back: Normal range of motion  and neck supple.  ?Skin: ?   General: Skin is warm and dry.  ?   Capillary Refill: Capillary refill takes less than 2 seconds.  ?Neurological:  ?   General: No focal deficit present.  ?   Mental Status: She is alert and oriented to person, place, and time.  ?   Deep Tendon Reflexes: Reflexes normal.  ?Psychiatric:     ?   Mood and Affect: Mood normal.     ?   Behavior: Behavior normal.  ? ? ?ED Results / Procedures / Treatments   ?Labs ?(all labs ordered are listed, but only abnormal results are displayed) ?Results for orders placed or performed during the hospital encounter of 07/17/21  ?CBC with Differential  ?Result Value Ref Range  ? WBC 7.4 4.0 - 10.5 K/uL  ? RBC 5.13 (H) 3.87 - 5.11 MIL/uL  ? Hemoglobin 14.5 12.0 - 15.0 g/dL  ?  HCT 44.6 36.0 - 46.0 %  ? MCV 86.9 80.0 - 100.0 fL  ? MCH 28.3 26.0 - 34.0 pg  ? MCHC 32.5 30.0 - 36.0 g/dL  ? RDW 13.9 11.5 - 15.5 %  ? Platelets 192 150 - 400 K/uL  ? nRBC 0.0 0.0 - 0.2 %  ? Neutrophils Relative % 64 %  ? Neutro Abs 4.8 1.7 - 7.7 K/uL  ? Lymphocytes Relative 19 %  ? Lymphs Abs 1.4 0.7 - 4.0 K/uL  ? Monocytes Relative 14 %  ? Monocytes Absolute 1.1 (H) 0.1 - 1.0 K/uL  ? Eosinophils Relative 1 %  ? Eosinophils Absolute 0.0 0.0 - 0.5 K/uL  ? Basophils Relative 1 %  ? Basophils Absolute 0.0 0.0 - 0.1 K/uL  ? Immature Granulocytes 1 %  ? Abs Immature Granulocytes 0.04 0.00 - 0.07 K/uL  ?Comprehensive metabolic panel  ?Result Value Ref Range  ? Sodium 136 135 - 145 mmol/L  ? Potassium 3.7 3.5 - 5.1 mmol/L  ? Chloride 104 98 - 111 mmol/L  ? CO2 24 22 - 32 mmol/L  ? Glucose, Bld 93 70 - 99 mg/dL  ? BUN 6 6 - 20 mg/dL  ? Creatinine, Ser 0.83 0.44 - 1.00 mg/dL  ? Calcium 8.8 (L) 8.9 - 10.3 mg/dL  ? Total Protein 7.4 6.5 - 8.1 g/dL  ? Albumin 4.0 3.5 - 5.0 g/dL  ? AST 14 (L) 15 - 41 U/L  ? ALT 11 0 - 44 U/L  ? Alkaline Phosphatase 62 38 - 126 U/L  ? Total Bilirubin 0.7 0.3 - 1.2 mg/dL  ? GFR, Estimated >60 >60 mL/min  ? Anion gap 8 5 - 15  ?Lipase, blood  ?Result Value Ref Range  ? Lipase 31 11 - 51 U/L  ?Urinalysis, Routine w reflex microscopic Urine, Clean Catch  ?Result Value Ref Range  ? Color, Urine YELLOW YELLOW  ? APPearance HAZY (A) CLEAR  ? Specific Gravity, Urine 1.025 1.005 - 1.030  ? pH 5.0 5.0 - 8.0  ? Glucose, UA NEGATIVE NEGATIVE mg/dL  ? Hgb urine dipstick NEGATIVE NEGATIVE  ? Bilirubin Urine NEGATIVE NEGATIVE  ? Ketones, ur 80 (A) NEGATIVE mg/dL  ? Protein, ur 30 (A) NEGATIVE mg/dL  ? Nitrite NEGATIVE NEGATIVE  ? Leukocytes,Ua NEGATIVE NEGATIVE  ? RBC / HPF 0-5 0 - 5 RBC/hpf  ? WBC, UA 0-5 0 - 5 WBC/hpf  ? Bacteria, UA FEW (A) NONE SEEN  ? Squamous Epithelial / LPF 6-10 0 - 5  ? Mucus PRESENT   ?Pregnancy, urine  ?Result  Value Ref Range  ? Preg Test, Ur NEGATIVE NEGATIVE  ?I-Stat beta hCG  blood, ED  ?Result Value Ref Range  ? I-stat hCG, quantitative <5.0 <5 mIU/mL  ? Comment 3          ? ?No results found. ? ? ?EKG ?None ? ?Radiology ?No results found. ? ?Procedures ?Procedures  ? ? ?Medications Ordered in ED ?Medications  ?ibuprofen (ADVIL) tablet 800 mg (has no administration in time range)  ?acetaminophen (TYLENOL) tablet 1,000 mg (has no administration in time range)  ? ? ?ED Course/ Medical Decision Making/ A&P ?  ?                        ?Medical Decision Making ?Feels swelling in lower abdomen x 1 week.  No associated symptoms  ? ?Amount and/or Complexity of Data Reviewed ?Labs: ordered. ?   Details: all labs reviewed by me: normal white count 7.4, normal hemoglobin 14.5 and normal platelets, urine without infection, negative pregnancy test and normal sodium, potassium 3.7 and normal creatinine .83 on chemistry panel ?Radiology: ordered and independent interpretation performed. ?   Details: [place] without torsion by me.  CT by me with fibroids ?Discussion of management or test interpretation with external provider(s): Case d/w Dr. Elly Modena who will review the images and the case.  We have discussed the case and she recommends MRI of the pelvis with contrast.  Call am GYN with results  ? ?Risk ?OTC drugs. ?Prescription drug management. ?Risk Details: Patient moved to private roomed.  Bloos cultures have been sent.  Patient is resting comfortably.   ? ? ? ?Final Clinical Impression(s) / ED Diagnoses ?Final diagnoses:  ?None  ? ? ?Signed out to Dr. Pearline Cables pending MRI of the pelvis and discussion with GYN  ? ?  ?Joanna Borawski, MD ?07/18/21 2549 ? ?

## 2021-07-18 NOTE — ED Notes (Signed)
RN provided pt instructions on self collect. ?

## 2021-07-19 LAB — GC/CHLAMYDIA PROBE AMP (~~LOC~~) NOT AT ARMC
Chlamydia: NEGATIVE
Comment: NEGATIVE
Comment: NORMAL
Neisseria Gonorrhea: NEGATIVE

## 2021-08-07 ENCOUNTER — Ambulatory Visit: Payer: BC Managed Care – PPO | Admitting: Family Medicine

## 2021-10-26 ENCOUNTER — Other Ambulatory Visit: Payer: Self-pay | Admitting: Obstetrics and Gynecology

## 2021-11-06 ENCOUNTER — Other Ambulatory Visit: Payer: Self-pay | Admitting: Obstetrics and Gynecology

## 2021-11-06 NOTE — Pre-Procedure Instructions (Signed)
Surgical Instructions    Your procedure is scheduled on Wednesday, August 16th.  Report to [facility] Main Entrance "A" at 8:15 A.M., then check in with the Admitting office.  Call this number if you have problems the morning of surgery:  [phone]   If you have any questions prior to your surgery date call [phone]: Open Monday-Friday 8am-4pm    Remember:  Do not eat after midnight the night before your surgery  You may drink clear liquids until 7:15 a.m. the morning of your surgery.   Clear liquids allowed are: Water, Non-Citrus Juices (without pulp), Carbonated Beverages, Clear Tea, Black Coffee ONLY (NO MILK, CREAM OR POWDERED CREAMER of any kind), and Gatorade    Take these medicines the morning of surgery with A SIP OF WATER:  LINZESS acetaminophen (TYLENOL)-as needed Eye drops-as needed.  As of today, STOP taking any Aspirin (unless otherwise instructed by your surgeon) Aleve, Naproxen, Ibuprofen, Motrin, Advil, Goody's, BC's, all herbal medications, fish oil, and all vitamins.           Do not wear jewelry or makeup. Do not wear lotions, powders, perfumes or deodorant. Do not shave 48 hours prior to surgery.  Do not bring valuables to the hospital. Do not wear nail polish, gel polish, artificial nails, or any other type of covering on natural nails (fingers and toes) If you have artificial nails or gel coating that need to be removed by a nail salon, please have this removed prior to surgery. Artificial nails or gel coating may interfere with anesthesia's ability to adequately monitor your vital signs.  [facility] is not responsible for any belongings or valuables.    Do NOT Smoke (Tobacco/Vaping)  24 hours prior to your procedure  If you use a CPAP at night, you may bring your mask for your overnight stay.   Contacts, glasses, hearing aids, dentures or partials may not be worn into surgery, please bring cases for these belongings   For patients admitted to the  hospital, discharge time will be determined by your treatment team.   Patients discharged the day of surgery will not be allowed to drive home, and someone needs to stay with them for 24 hours.   SURGICAL WAITING ROOM VISITATION Patients having surgery or a procedure may have no more than 2 support people in the waiting area - these visitors may rotate.   Children under the age of 90 must have an adult with them who is not the patient. If the patient needs to stay at the hospital during part of their recovery, the visitor guidelines for inpatient rooms apply. Pre-op nurse will coordinate an appropriate time for 1 support person to accompany patient in pre-op.  This support person may not rotate.   Please refer to the [facility] website for the visitor guidelines for Inpatients (after your surgery is over and you are in a regular room).    Special instructions:    Oral Hygiene is also important to reduce your risk of infection.  Remember - BRUSH YOUR TEETH THE MORNING OF SURGERY WITH YOUR REGULAR TOOTHPASTE   [facility]- Preparing For Surgery  Before surgery, you can play an important role. Because skin is not sterile, your skin needs to be as free of germs as possible. You can reduce the number of germs on your skin by washing with CHG (chlorahexidine gluconate) Soap before surgery.  CHG is an antiseptic cleaner which kills germs and bonds with the skin to continue killing germs even  after washing.     Please do not use if you have an allergy to CHG or antibacterial soaps. If your skin becomes reddened/irritated stop using the CHG.  Do not shave (including legs and underarms) for at least 48 hours prior to first CHG shower. It is OK to shave your face.  Please follow these instructions carefully.     Shower the NIGHT BEFORE SURGERY and the MORNING OF SURGERY with CHG Soap.   If you chose to wash your hair, wash your hair first as usual with your normal shampoo. After you shampoo,  rinse your hair and body thoroughly to remove the shampoo.  Then [employer] and genitals (private parts) with your normal soap and rinse thoroughly to remove soap.  After that Use CHG Soap as you would any other liquid soap. You can apply CHG directly to the skin and wash gently with a scrungie or a clean washcloth.   Apply the CHG Soap to your body ONLY FROM THE NECK DOWN.  Do not use on open wounds or open sores. Avoid contact with your eyes, ears, mouth and genitals (private parts). Wash Face and genitals (private parts)  with your normal soap.   Wash thoroughly, paying special attention to the area where your surgery will be performed.  Thoroughly rinse your body with warm water from the neck down.  DO NOT shower/wash with your normal soap after using and rinsing off the CHG Soap.  Pat yourself dry with a CLEAN TOWEL.  Wear CLEAN PAJAMAS to bed the night before surgery  Place CLEAN SHEETS on your bed the night before your surgery  DO NOT SLEEP WITH PETS.   Day of Surgery:  Take a shower with CHG soap. Wear Clean/Comfortable clothing the morning of surgery Do not apply any deodorants/lotions.   Remember to brush your teeth WITH YOUR REGULAR TOOTHPASTE.    If you received a COVID test during your pre-op visit, it is requested that you wear a mask when out in public, stay away from anyone that may not be feeling well, and notify your surgeon if you develop symptoms. If you have been in contact with anyone that has tested positive in the last 10 days, please notify your surgeon.    Please read over the following fact sheets that you were given.

## 2021-11-07 ENCOUNTER — Other Ambulatory Visit: Payer: Self-pay

## 2021-11-07 ENCOUNTER — Encounter (HOSPITAL_COMMUNITY)
Admission: RE | Admit: 2021-11-07 | Discharge: 2021-11-07 | Disposition: A | Discharge status: Home / Self Care | Payer: BC Managed Care – PPO | Source: Ambulatory Visit | Attending: Obstetrics and Gynecology | Admitting: Obstetrics and Gynecology

## 2021-11-07 ENCOUNTER — Encounter (HOSPITAL_COMMUNITY): Payer: Self-pay

## 2021-11-07 VITALS — BP 112/74 | HR 80 | Temp 98.6°F | Resp 17 | Ht 62.0 in | Wt 173.3 lb

## 2021-11-07 DIAGNOSIS — Z01818 Encounter for other preprocedural examination: Secondary | ICD-10-CM | POA: Diagnosis present

## 2021-11-07 HISTORY — DX: Depression, unspecified: F32.A

## 2021-11-07 HISTORY — DX: Anxiety disorder, unspecified: F41.9

## 2021-11-07 HISTORY — DX: Attention-deficit hyperactivity disorder, unspecified type: F90.9

## 2021-11-07 HISTORY — DX: Unspecified osteoarthritis, unspecified site: M19.90

## 2021-11-07 LAB — CBC
HCT: 43.4 % (ref 36.0–46.0)
Hemoglobin: 13.9 g/dL (ref 12.0–15.0)
MCH: 27.6 pg (ref 26.0–34.0)
MCHC: 32 g/dL (ref 30.0–36.0)
MCV: 86.1 fL (ref 80.0–100.0)
Platelets: 253 10*3/uL (ref 150–400)
RBC: 5.04 MIL/uL (ref 3.87–5.11)
RDW: 14 % (ref 11.5–15.5)
WBC: 9.6 10*3/uL (ref 4.0–10.5)
nRBC: 0 % (ref 0.0–0.2)

## 2021-11-07 LAB — TYPE AND SCREEN
ABO/RH(D): A POS
Antibody Screen: NEGATIVE

## 2021-11-07 NOTE — Progress Notes (Signed)
PCP - denies Cardiologist - denies  PPM/ICD - denies   Chest x-ray - 01/11/19 EKG - denies Stress Test - denies ECHO - denies Cardiac Cath - denies  Sleep Study - denies  DM- denies  ASA/Blood Thinner Instructions: n/a   ERAS Protcol - yes, no drink   COVID TEST- n/a   Anesthesia review: no  Patient denies shortness of breath, fever, cough and chest pain at PAT appointment   All instructions explained to the patient, with a verbal understanding of the material. Patient agrees to go over the instructions while at home for a better understanding. The opportunity to ask questions was provided.

## 2021-11-14 ENCOUNTER — Inpatient Hospital Stay (HOSPITAL_COMMUNITY)
Admission: RE | Admit: 2021-11-14 | Discharge: 2021-11-16 | DRG: 743 | Disposition: A | Discharge status: Home / Self Care | Payer: BC Managed Care – PPO | Attending: Obstetrics and Gynecology | Admitting: Obstetrics and Gynecology

## 2021-11-14 ENCOUNTER — Inpatient Hospital Stay (HOSPITAL_COMMUNITY): Payer: BC Managed Care – PPO | Admitting: Anesthesiology

## 2021-11-14 ENCOUNTER — Other Ambulatory Visit: Payer: Self-pay

## 2021-11-14 ENCOUNTER — Encounter (HOSPITAL_COMMUNITY): Payer: Self-pay | Admitting: Obstetrics and Gynecology

## 2021-11-14 ENCOUNTER — Encounter (HOSPITAL_COMMUNITY)
Admission: RE | Disposition: A | Discharge status: Home / Self Care | Payer: Self-pay | Source: Home / Self Care | Attending: Obstetrics and Gynecology

## 2021-11-14 DIAGNOSIS — D259 Leiomyoma of uterus, unspecified: Principal | ICD-10-CM | POA: Diagnosis present

## 2021-11-14 DIAGNOSIS — K6289 Other specified diseases of anus and rectum: Secondary | ICD-10-CM | POA: Diagnosis present

## 2021-11-14 DIAGNOSIS — N8003 Adenomyosis of the uterus: Secondary | ICD-10-CM | POA: Diagnosis present

## 2021-11-14 DIAGNOSIS — Z79899 Other long term (current) drug therapy: Secondary | ICD-10-CM

## 2021-11-14 DIAGNOSIS — Z91018 Allergy to other foods: Secondary | ICD-10-CM

## 2021-11-14 DIAGNOSIS — Z9889 Other specified postprocedural states: Secondary | ICD-10-CM

## 2021-11-14 DIAGNOSIS — Z87891 Personal history of nicotine dependence: Secondary | ICD-10-CM | POA: Diagnosis not present

## 2021-11-14 HISTORY — PX: CHROMOPERTUBATION: SHX6288

## 2021-11-14 HISTORY — PX: MYOMECTOMY: SHX85

## 2021-11-14 HISTORY — PX: LAPAROTOMY: SHX154

## 2021-11-14 LAB — COMPREHENSIVE METABOLIC PANEL
ALT: 12 U/L (ref 0–44)
AST: 20 U/L (ref 15–41)
Albumin: 3.9 g/dL (ref 3.5–5.0)
Alkaline Phosphatase: 58 U/L (ref 38–126)
Anion gap: 6 (ref 5–15)
BUN: 6 mg/dL (ref 6–20)
CO2: 25 mmol/L (ref 22–32)
Calcium: 8.6 mg/dL — ABNORMAL LOW (ref 8.9–10.3)
Chloride: 107 mmol/L (ref 98–111)
Creatinine, Ser: 0.88 mg/dL (ref 0.44–1.00)
GFR, Estimated: >60 mL/min (ref >60)
Glucose, Bld: 86 mg/dL (ref 70–99)
Potassium: 3.6 mmol/L (ref 3.5–5.1)
Sodium: 138 mmol/L (ref 135–145)
Total Bilirubin: 0.7 mg/dL (ref 0.3–1.2)
Total Protein: 7 g/dL (ref 6.5–8.1)

## 2021-11-14 LAB — POCT PREGNANCY, URINE: Preg Test, Ur: NEGATIVE

## 2021-11-14 LAB — ABO/RH: ABO/RH(D): A POS

## 2021-11-14 SURGERY — MYOMECTOMY, ABDOMINAL APPROACH
Anesthesia: General

## 2021-11-14 MED ORDER — ONDANSETRON HCL 4 MG PO TABS: 4.0000 mg | ORAL_TABLET | Freq: Four times a day (QID) | ORAL | Status: DC | PRN

## 2021-11-14 MED ORDER — DEXTROSE IN LACTATED RINGERS 5 % IV SOLN: INTRAVENOUS | Status: DC

## 2021-11-14 MED ORDER — METOCLOPRAMIDE HCL 10 MG PO TABS
10.0000 mg | ORAL_TABLET | Freq: Three times a day (TID) | ORAL | Status: DC
Start: 2021-11-14 — End: 2021-11-16
  Administered 2021-11-14 – 2021-11-16 (×6): 10 mg via ORAL
  Filled 2021-11-14 (×9): qty 1

## 2021-11-14 MED ORDER — ACETAMINOPHEN 10 MG/ML IV SOLN
INTRAVENOUS | Status: AC
  Filled 2021-11-14: qty 100

## 2021-11-14 MED ORDER — DIPHENHYDRAMINE HCL 50 MG/ML IJ SOLN: 12.5000 mg | Freq: Four times a day (QID) | INTRAMUSCULAR | Status: DC | PRN

## 2021-11-14 MED ORDER — CHLORHEXIDINE GLUCONATE 0.12 % MT SOLN
15.0000 mL | Freq: Once | OROMUCOSAL | Status: AC
  Administered 2021-11-14: 15 mL via OROMUCOSAL
  Filled 2021-11-14: qty 15

## 2021-11-14 MED ORDER — IBUPROFEN 600 MG PO TABS
600.0000 mg | ORAL_TABLET | Freq: Four times a day (QID) | ORAL | Status: DC
  Administered 2021-11-14 – 2021-11-16 (×6): 600 mg via ORAL
  Filled 2021-11-14 (×7): qty 1

## 2021-11-14 MED ORDER — VASOPRESSIN 20 UNIT/ML IV SOLN
INTRAVENOUS | Status: DC | PRN
  Administered 2021-11-14: 18 mL via INTRAMUSCULAR

## 2021-11-14 MED ORDER — ONDANSETRON HCL 4 MG/2ML IJ SOLN: 4.0000 mg | Freq: Four times a day (QID) | INTRAMUSCULAR | Status: DC | PRN

## 2021-11-14 MED ORDER — SODIUM CHLORIDE (PF) 0.9 % IJ SOLN
INTRAMUSCULAR | Status: AC
  Filled 2021-11-14: qty 100

## 2021-11-14 MED ORDER — ROCURONIUM BROMIDE 10 MG/ML (PF) SYRINGE
PREFILLED_SYRINGE | INTRAVENOUS | Status: DC | PRN
  Administered 2021-11-14: 80 mg via INTRAVENOUS

## 2021-11-14 MED ORDER — ONDANSETRON HCL 4 MG/2ML IJ SOLN: 4.0000 mg | Freq: Once | INTRAMUSCULAR | Status: DC | PRN

## 2021-11-14 MED ORDER — SIMETHICONE 80 MG PO CHEW: 80.0000 mg | CHEWABLE_TABLET | Freq: Four times a day (QID) | ORAL | Status: DC | PRN

## 2021-11-14 MED ORDER — HYDROMORPHONE 1 MG/ML IV SOLN
INTRAVENOUS | Status: DC
  Administered 2021-11-15: 0.6 mg via INTRAVENOUS
  Administered 2021-11-15: 1 mg via INTRAVENOUS
  Filled 2021-11-14: qty 30

## 2021-11-14 MED ORDER — CEFAZOLIN SODIUM-DEXTROSE 2-4 GM/100ML-% IV SOLN
2.0000 g | INTRAVENOUS | Status: AC
  Administered 2021-11-14: 2 g via INTRAVENOUS
  Filled 2021-11-14: qty 100

## 2021-11-14 MED ORDER — ONDANSETRON HCL 4 MG/2ML IJ SOLN
INTRAMUSCULAR | Status: DC | PRN
  Administered 2021-11-14: 4 mg via INTRAVENOUS

## 2021-11-14 MED ORDER — OXYCODONE HCL 5 MG PO TABS: 5.0000 mg | ORAL_TABLET | Freq: Once | ORAL | Status: DC | PRN

## 2021-11-14 MED ORDER — NALOXONE HCL 0.4 MG/ML IJ SOLN: 0.4000 mg | INTRAMUSCULAR | Status: DC | PRN

## 2021-11-14 MED ORDER — 0.9 % SODIUM CHLORIDE (POUR BTL) OPTIME
TOPICAL | Status: DC | PRN
  Administered 2021-11-14 (×2): 1000 mL

## 2021-11-14 MED ORDER — MIDAZOLAM HCL 2 MG/2ML IJ SOLN
INTRAMUSCULAR | Status: DC | PRN
  Administered 2021-11-14: 2 mg via INTRAVENOUS

## 2021-11-14 MED ORDER — DROPERIDOL 2.5 MG/ML IJ SOLN
INTRAMUSCULAR | Status: AC
  Filled 2021-11-14: qty 2

## 2021-11-14 MED ORDER — MAGNESIUM HYDROXIDE 400 MG/5ML PO SUSP: 30.0000 mL | Freq: Every day | ORAL | Status: DC | PRN

## 2021-11-14 MED ORDER — SUGAMMADEX SODIUM 200 MG/2ML IV SOLN
INTRAVENOUS | Status: DC | PRN
  Administered 2021-11-14: 157 mg via INTRAVENOUS

## 2021-11-14 MED ORDER — ACETAMINOPHEN 10 MG/ML IV SOLN
1000.0000 mg | Freq: Once | INTRAVENOUS | Status: DC | PRN
  Administered 2021-11-14: 1000 mg via INTRAVENOUS

## 2021-11-14 MED ORDER — KETOROLAC TROMETHAMINE 30 MG/ML IJ SOLN
INTRAMUSCULAR | Status: DC | PRN
  Administered 2021-11-14: 30 mg via INTRAVENOUS

## 2021-11-14 MED ORDER — LACTATED RINGERS IV SOLN: INTRAVENOUS | Status: DC

## 2021-11-14 MED ORDER — POVIDONE-IODINE 10 % EX SWAB
2.0000 | Freq: Once | CUTANEOUS | Status: AC
  Administered 2021-11-14: 2 via TOPICAL

## 2021-11-14 MED ORDER — HYDROMORPHONE HCL 1 MG/ML IJ SOLN
0.2500 mg | INTRAMUSCULAR | Status: DC | PRN
  Administered 2021-11-14 (×2): 0.5 mg via INTRAVENOUS

## 2021-11-14 MED ORDER — MIDAZOLAM HCL 2 MG/2ML IJ SOLN
INTRAMUSCULAR | Status: AC
Start: 2021-11-14 — End: ?
  Filled 2021-11-14: qty 2

## 2021-11-14 MED ORDER — KETOROLAC TROMETHAMINE 30 MG/ML IJ SOLN
INTRAMUSCULAR | Status: AC
  Filled 2021-11-14: qty 1

## 2021-11-14 MED ORDER — ACETAMINOPHEN 325 MG PO TABS: 650.0000 mg | ORAL_TABLET | ORAL | Status: DC | PRN

## 2021-11-14 MED ORDER — TRANEXAMIC ACID-NACL 1000-0.7 MG/100ML-% IV SOLN
1000.0000 mg | INTRAVENOUS | Status: AC
  Administered 2021-11-14: 1000 mg via INTRAVENOUS
  Filled 2021-11-14: qty 100

## 2021-11-14 MED ORDER — ORAL CARE MOUTH RINSE: 15.0000 mL | Freq: Once | OROMUCOSAL | Status: AC

## 2021-11-14 MED ORDER — SODIUM CHLORIDE 0.9% FLUSH: 9.0000 mL | INTRAVENOUS | Status: DC | PRN

## 2021-11-14 MED ORDER — MENTHOL 3 MG MT LOZG: 1.0000 | LOZENGE | OROMUCOSAL | Status: DC | PRN

## 2021-11-14 MED ORDER — DIPHENHYDRAMINE HCL 12.5 MG/5ML PO ELIX: 12.5000 mg | ORAL_SOLUTION | Freq: Four times a day (QID) | ORAL | Status: DC | PRN

## 2021-11-14 MED ORDER — DEXMEDETOMIDINE (PRECEDEX) IN NS 20 MCG/5ML (4 MCG/ML) IV SYRINGE
PREFILLED_SYRINGE | INTRAVENOUS | Status: DC | PRN
  Administered 2021-11-14: 8 ug via INTRAVENOUS
  Administered 2021-11-14: 12 ug via INTRAVENOUS

## 2021-11-14 MED ORDER — METHYLENE BLUE 1 % INJ SOLN
INTRAVENOUS | Status: DC | PRN
  Administered 2021-11-14: 40 mL via SUBMUCOSAL

## 2021-11-14 MED ORDER — PROPOFOL 10 MG/ML IV BOLUS
INTRAVENOUS | Status: DC | PRN
  Administered 2021-11-14: 200 mg via INTRAVENOUS

## 2021-11-14 MED ORDER — FENTANYL CITRATE (PF) 250 MCG/5ML IJ SOLN
INTRAMUSCULAR | Status: AC
  Filled 2021-11-14: qty 5

## 2021-11-14 MED ORDER — FENTANYL CITRATE (PF) 250 MCG/5ML IJ SOLN
INTRAMUSCULAR | Status: DC | PRN
  Administered 2021-11-14: 100 ug via INTRAVENOUS
  Administered 2021-11-14 (×3): 50 ug via INTRAVENOUS

## 2021-11-14 MED ORDER — LIDOCAINE 2% (20 MG/ML) 5 ML SYRINGE
INTRAMUSCULAR | Status: DC | PRN
  Administered 2021-11-14: 100 mg via INTRAVENOUS

## 2021-11-14 MED ORDER — DEXAMETHASONE SODIUM PHOSPHATE 10 MG/ML IJ SOLN
INTRAMUSCULAR | Status: DC | PRN
  Administered 2021-11-14: 10 mg via INTRAVENOUS

## 2021-11-14 MED ORDER — OXYCODONE HCL 5 MG PO TABS: 5.0000 mg | ORAL_TABLET | ORAL | Status: DC | PRN

## 2021-11-14 MED ORDER — OXYCODONE HCL 5 MG/5ML PO SOLN: 5.0000 mg | Freq: Once | ORAL | Status: DC | PRN

## 2021-11-14 MED ORDER — PROPOFOL 500 MG/50ML IV EMUL
INTRAVENOUS | Status: DC | PRN
  Administered 2021-11-14: 50 ug/kg/min via INTRAVENOUS

## 2021-11-14 MED ORDER — HYDROMORPHONE HCL 1 MG/ML IJ SOLN
INTRAMUSCULAR | Status: AC
  Filled 2021-11-14: qty 1

## 2021-11-14 MED ORDER — DROPERIDOL 2.5 MG/ML IJ SOLN
INTRAMUSCULAR | Status: DC | PRN
  Administered 2021-11-14: .625 mg via INTRAVENOUS

## 2021-11-14 MED ORDER — VASOPRESSIN 20 UNIT/ML IV SOLN
INTRAVENOUS | Status: AC
  Filled 2021-11-14: qty 1

## 2021-11-14 MED ORDER — METHYLENE BLUE 1 % INJ SOLN
INTRAVENOUS | Status: AC
Start: 2021-11-14 — End: ?
  Filled 2021-11-14: qty 10

## 2021-11-14 SURGICAL SUPPLY — 62 items
BAG COUNTER SPONGE SURGICOUNT (BAG) ×2 IMPLANT
BARRIER ADHS 3X4 INTERCEED (GAUZE/BANDAGES/DRESSINGS) ×1 IMPLANT
BENZOIN TINCTURE PRP APPL 2/3 (GAUZE/BANDAGES/DRESSINGS) ×2 IMPLANT
BLADE SURG 10 STRL SS (BLADE) ×4 IMPLANT
CANISTER SUCT 3000ML PPV (MISCELLANEOUS) ×2 IMPLANT
CNTNR URN SCR LID CUP LEK RST (MISCELLANEOUS) IMPLANT
CONT SPEC 4OZ STRL OR WHT (MISCELLANEOUS) ×2
DRAPE CESAREAN BIRTH W POUCH (DRAPES) ×2 IMPLANT
DRAPE WARM FLUID 44X44 (DRAPES) IMPLANT
DRSG OPSITE POSTOP 4X10 (GAUZE/BANDAGES/DRESSINGS) ×2 IMPLANT
DRSG OPSITE POSTOP 4X6 (GAUZE/BANDAGES/DRESSINGS) ×1 IMPLANT
DURAPREP 26ML APPLICATOR (WOUND CARE) ×2 IMPLANT
ELECT CAUTERY BLADE 6.4 (BLADE) IMPLANT
ELECT NDL TIP 2.8 STRL (NEEDLE) IMPLANT
ELECT NEEDLE TIP 2.8 STRL (NEEDLE) IMPLANT
GAUZE 4X4 16PLY ~~LOC~~+RFID DBL (SPONGE) IMPLANT
GLOVE BIO SURGEON STRL SZ 6.5 (GLOVE) ×2 IMPLANT
GLOVE BIOGEL PI IND STRL 7.0 (GLOVE) ×2 IMPLANT
GLOVE BIOGEL PI INDICATOR 7.0 (GLOVE) ×4
GOWN STRL REUS W/ TWL LRG LVL3 (GOWN DISPOSABLE) ×2 IMPLANT
GOWN STRL REUS W/TWL LRG LVL3 (GOWN DISPOSABLE) ×4
HIBICLENS CHG 4% 4OZ BTL (MISCELLANEOUS) ×2 IMPLANT
KIT TURNOVER KIT B (KITS) ×2 IMPLANT
MANIFOLD NEPTUNE II (INSTRUMENTS) ×2 IMPLANT
NDL FILTER BLUNT 18X1 1/2 (NEEDLE) IMPLANT
NEEDLE FILTER BLUNT 18X 1/2SAF (NEEDLE) ×4
NEEDLE FILTER BLUNT 18X1 1/2 (NEEDLE) ×2 IMPLANT
NEEDLE HYPO 22GX1.5 SAFETY (NEEDLE) ×4 IMPLANT
NS IRRIG 1000ML POUR BTL (IV SOLUTION) ×2 IMPLANT
PACK ABDOMINAL GYN (CUSTOM PROCEDURE TRAY) ×2 IMPLANT
PAD ARMBOARD 7.5X6 YLW CONV (MISCELLANEOUS) ×2 IMPLANT
PAD OB MATERNITY 4.3X12.25 (PERSONAL CARE ITEMS) ×2 IMPLANT
PENCIL BUTTON HOLSTER BLD 10FT (ELECTRODE) ×2 IMPLANT
PENCIL SMOKE EVACUATOR (MISCELLANEOUS) ×2 IMPLANT
PROTECTOR NERVE ULNAR (MISCELLANEOUS) ×2 IMPLANT
RTRCTR C-SECT PINK 25CM LRG (MISCELLANEOUS) ×2 IMPLANT
SPIKE FLUID TRANSFER (MISCELLANEOUS) ×8 IMPLANT
SPONGE T-LAP 18X18 ~~LOC~~+RFID (SPONGE) ×5 IMPLANT
STAPLER VISISTAT 35W (STAPLE) ×2 IMPLANT
STRIP CLOSURE SKIN 1/2X4 (GAUZE/BANDAGES/DRESSINGS) ×2 IMPLANT
SUT CHROMIC 0 CT 1 (SUTURE) ×2 IMPLANT
SUT CHROMIC 2 0 CT 1 (SUTURE) ×3 IMPLANT
SUT MNCRL AB 3-0 PS2 27 (SUTURE) ×2 IMPLANT
SUT PDS AB 0 CT 36 (SUTURE) IMPLANT
SUT PLAIN 2 0 (SUTURE) ×2
SUT PLAIN 2 0 XLH (SUTURE) ×2 IMPLANT
SUT PLAIN ABS 2-0 CT1 27XMFL (SUTURE) IMPLANT
SUT VIC AB 0 CT1 18XCR BRD8 (SUTURE) ×1 IMPLANT
SUT VIC AB 0 CT1 27 (SUTURE) ×4
SUT VIC AB 0 CT1 27XBRD ANBCTR (SUTURE) ×2 IMPLANT
SUT VIC AB 0 CT1 36 (SUTURE) ×4 IMPLANT
SUT VIC AB 0 CT1 8-18 (SUTURE) ×4
SUT VIC AB 2-0 CT1 27 (SUTURE) ×6
SUT VIC AB 2-0 CT1 TAPERPNT 27 (SUTURE) IMPLANT
SUT VIC AB 2-0 SH 27 (SUTURE) ×4
SUT VIC AB 2-0 SH 27XBRD (SUTURE) ×2 IMPLANT
SUT VICRYL 0 TIES 12 18 (SUTURE) ×2 IMPLANT
SYR 50ML LL SCALE MARK (SYRINGE) IMPLANT
SYR 5ML LL (SYRINGE) ×2 IMPLANT
SYR CONTROL 10ML LL (SYRINGE) ×4 IMPLANT
TOWEL GREEN STERILE FF (TOWEL DISPOSABLE) ×4 IMPLANT
TRAY FOLEY W/BAG SLVR 14FR (SET/KITS/TRAYS/PACK) ×2 IMPLANT

## 2021-11-14 NOTE — Anesthesia Procedure Notes (Signed)
Procedure Name: Intubation Date/Time: 11/14/2021 10:15 AM  Performed by: Minerva Ends, CRNAPre-anesthesia Checklist: Patient identified, Emergency Drugs available, Suction available and Patient being monitored Patient Re-evaluated:Patient Re-evaluated prior to induction Oxygen Delivery Method: Circle system utilized Preoxygenation: Pre-oxygenation with 100% oxygen Induction Type: IV induction Ventilation: Mask ventilation without difficulty Laryngoscope Size: Mac and 3 Grade View: Grade I Tube type: Oral Tube size: 7.0 mm Number of attempts: 1 Airway Equipment and Method: Stylet and Oral airway Placement Confirmation: ETT inserted through vocal cords under direct vision, positive ETCO2 and breath sounds checked- equal and bilateral Secured at: 22 cm Tube secured with: Tape Dental Injury: Teeth and Oropharynx as per pre-operative assessment

## 2021-11-14 NOTE — H&P (Signed)
Joanna Smith is an 36 y.o. female. Who presents for abdominal myomectomy.  She has had difficulty with periods for years lasting 5-10 days.  No dizziness or SOB . She also has abdominal fullness and pain.     Pertinent Gynecological History: Menses: usually lasting 5 to 10 days Bleeding: see above Contraception: none DES exposure: denies Blood transfusions: none Sexually transmitted diseases: no past history Previous GYN Procedures:  drainage of TOA   Last mammogram:  NA  Date:  Last pap: normal Date: 5/23 OB History: G0, P0   Menstrual History: Menarche age: 77 Patient's last menstrual period was 10/14/2021 (approximate).    Past Medical History:  Diagnosis Date   ADHD (attention deficit hyperactivity disorder)    Anxiety    Arthritis    lower back   Depression    IBS (irritable bowel syndrome)     Past Surgical History:  Procedure Laterality Date   HERNIA REPAIR Right    inguinal   IR RADIOLOGIST EVAL & MGMT  01/28/2019   IR RADIOLOGIST EVAL & MGMT  02/18/2019    Family History  Family history unknown: Yes    Social History:  reports that she quit smoking about 7 years ago. Her smoking use included cigarettes. She has never used smokeless tobacco. She reports that she does not currently use alcohol. She reports current drug use. Drug: Marijuana.  Allergies:  Allergies  Allergen Reactions   Macadamia Nut Oil Anaphylaxis    Medications Prior to Admission  Medication Sig Dispense Refill Last Dose   acetaminophen (TYLENOL) 500 MG tablet Take 1,000 mg by mouth every 6 (six) hours as needed for moderate pain or headache.   Past Week   ibuprofen (ADVIL) 200 MG tablet Take 200 mg by mouth every 6 (six) hours as needed for headache or moderate pain.   Past Week   LINZESS 290 MCG CAPS capsule Take 290 mcg by mouth every morning.   Past Month   Soft Lens Products (CLEAR EYES CONTACT LENS RELIEF) SOLN Place 1-2 drops into both eyes daily as needed (irritation.).   Past  Week   doxycycline (VIBRAMYCIN) 100 MG capsule Take 1 capsule (100 mg total) by mouth 2 (two) times daily. (Patient not taking: Reported on 10/31/2021) 20 capsule 0 Not Taking   metroNIDAZOLE (FLAGYL) 500 MG tablet Take 1 tablet (500 mg total) by mouth 2 (two) times daily. (Patient not taking: Reported on 10/31/2021) 14 tablet 0 Not Taking   ondansetron (ZOFRAN) 4 MG tablet Take 1 tablet (4 mg total) by mouth every 4 (four) hours as needed for nausea or vomiting. (Patient not taking: Reported on 10/31/2021) 8 tablet 0 Not Taking    Review of Systems  Blood pressure 133/84, pulse 75, temperature 98.6 F (37 C), temperature source Oral, resp. rate 18, height '5\' 2"'$  (1.575 m), weight 78.5 kg, last menstrual period 10/14/2021, SpO2 96 %. Physical Exam Physical Examination: General appearance - alert, well appearing, and in no distress Heart - normal rate and regular rhythm Abdomen - soft, nontender, nondistended, no masses or organomegaly ENLARGED UTERUS MOBILE NT Breasts - breasts appear normal, no suspicious masses, no skin or nipple changes or axillary nodes Pelvic - normal external genitalia, vulva, vagina, cervix, uterus and adnexa Rectal - normal rectal, no masses Neurological - alert, oriented, normal speech, no focal findings or movement disorder noted Musculoskeletal - no joint tenderness, deformity or swelling Extremities - peripheral pulses normal, no pedal edema, no clubbing or cyanosis   Results for orders  placed or performed during the hospital encounter of 11/14/21 (from the past 24 hour(s))  ABO/Rh     Status: None   Collection Time: 11/14/21  8:50 AM  Result Value Ref Range   ABO/RH(D)      [facility] Performed at [facility] [address] [place], [place] [address]     No results found.  Assessment/Plan: SX FIBROIDS  Plan abdominal myomectomy Due to pts H/O TOA she was referred to general surgery.  They believe she may have Smith rectal cyst which should be referred out  to remove.  They will be available for help with LOA if needed Chromopertubation pt plans to concieve Risks of bleeding, infection, damage to internal organs all reviewed Pt could not tolerate bowel prep  Joanna Smith Verneal Smith 11/14/2021, 9:47 AM

## 2021-11-14 NOTE — Transfer of Care (Signed)
Immediate Anesthesia Transfer of Care Note  Patient: Joanna Smith  Procedure(s) Performed: ABDOMINAL MYOMECTOMY CHROMOPERTUBATION EXPLORATORY LAPAROTOMY  Patient Location: PACU  Anesthesia Type:General  Level of Consciousness: awake  Airway & Oxygen Therapy: Patient Spontanous Breathing  Post-op Assessment: Report given to RN and Post -op Vital signs reviewed and stable  Post vital signs: Reviewed and stable  Last Vitals:  Vitals Value Taken Time  BP 110/70 11/14/21 1228  Temp    Pulse 79 11/14/21 1230  Resp 18 11/14/21 1230  SpO2 96 % 11/14/21 1230  Vitals shown include unvalidated device data.  Last Pain:  Vitals:   11/14/21 0910  TempSrc:   PainSc: 0-No pain         Complications: No notable events documented.

## 2021-11-14 NOTE — Op Note (Signed)
Preop diagnosis: Symptomatic fibroids  Postdiagnosis:  Same. Clubbed tubes.  Normal appearing anatomy and ovaries Procedure: Abdominal myomectomy Surgeon: Dr. Charlesetta Garibaldi Assistant: Dr Alesia Richards Anesthesia: General endotracheal tube Estimated blood loss is 500 cc Urine output: 500 cc IV fluids: 2200 cc  Procedure in detail:   Attention was placed on the vagina.  A bivalve speculum was placed.  The anterior lip of the cx was grasped with a tenaculum.  The rumi was placed in the uterus.   She was taken to the operating room placed in dorsal supine position and prepped and draped in the normal sterile fashion.  A time out was done.  A vertical  skin incision was made with a knife and carried down to the fascia using Bovie cautery. The incsion was made just below her intradermal piercing just below the belly button.  The fascia was incised in midline extended superiorly and inferiorly.  . Cokers x2 placed in the superior aspect of the fascia was dissected off the rectus muscles both sharply and bluntly.  Peritoneum was identified And entered sharply with Metzenbaum scissors. The incision was extended superiorly and inferiorly with good visualization of bowel bladder. I exteriorized the uterus by placing a tenaculum on the fundus and I was able to lift the uterus out of the abdominal cavity using the towel clamps.  The uterus felt "mushy" to be consistent with adenomyosis. Both tubes appeared clubbed.   Both ovaries had normal appearance.  The bowel was packed away with moist sponges. Petrussin  was placed along the fundus in the midline.  The incision was about 4 cm long. The fibroid was then bluntly and sharply dissected out of the cavity was about 8 cm in size. The endometrial canal was not entered. The incision was closed in layers using 0 and 2-0 Vicryl with figure-of-eight interrupted suture. The serosa was closed using a 2-0 Vicryl in a baseball stitch fashion.  Petrussin was placed over two other fibroids  and they were bluntly dissected without any problems.  They were on the left side of the uterus.  One about 3 cm the other about 8 cm in size.  Areas were made hemostatic with o and  2-0 Vicryl figure-of-eight stitch.  Baseball stitich was placed on the serosa.  Chromopertubation was done and both tubes were clubbed and had no pill.  Warm Irrigation was done. hemoStasis was assured. Intercede  was placed along the all  suture lines. Packing was removed.   The uterus was replaced back into the pelvic cavity. The peritoneum was closed to 2-0 chromic.  Muscles were irrigated and hemostatic with Bovie cautery and any bleeding areas. The fascia was closed using 0 Vicryl in a running fashion. She is tissue was irrigated and made hemostatic with Bovie cautery. 2-0 plain interrupted sutures were used to reapproximate the subcutaneous tissue. The skin was closed with 3-0 Monocryl subarticular fashion.  The rumi was removed.   counts were correct patient went to recovery in stable condition this is Dr. Charlesetta Garibaldi dictating I was present for the entire procedure thank you

## 2021-11-14 NOTE — Anesthesia Postprocedure Evaluation (Signed)
Anesthesia Post Note  Patient: Joanna Smith  Procedure(s) Performed: ABDOMINAL MYOMECTOMY CHROMOPERTUBATION EXPLORATORY LAPAROTOMY     Patient location during evaluation: PACU Anesthesia Type: General Level of consciousness: awake and alert Pain management: pain level controlled Vital Signs Assessment: post-procedure vital signs reviewed and stable Respiratory status: spontaneous breathing, nonlabored ventilation, respiratory function stable and patient connected to nasal cannula oxygen Cardiovascular status: blood pressure returned to baseline and stable Postop Assessment: no apparent nausea or vomiting Anesthetic complications: no   No notable events documented.  Last Vitals:  Vitals:   11/14/21 1245 11/14/21 1300  BP: 111/70 109/64  Pulse: 80 72  Resp: 20 16  Temp:    SpO2: 93% 93%    Last Pain:  Vitals:   11/14/21 1230  TempSrc:   PainSc: Asleep                 Hazle Ogburn S

## 2021-11-14 NOTE — Anesthesia Preprocedure Evaluation (Addendum)
Anesthesia Evaluation  Patient identified by MRN, date of birth, ID band Patient awake    Reviewed: Allergy & Precautions, NPO status , Patient's Chart, lab work & pertinent test results  Airway Mallampati: II  TM Distance: >3 FB Neck ROM: Full    Dental no notable dental hx.    Pulmonary neg pulmonary ROS, former smoker,    Pulmonary exam normal breath sounds clear to auscultation       Cardiovascular negative cardio ROS Normal cardiovascular exam Rhythm:Regular Rate:Normal     Neuro/Psych Anxiety negative neurological ROS     GI/Hepatic negative GI ROS, Neg liver ROS,   Endo/Other  negative endocrine ROS  Renal/GU negative Renal ROS  negative genitourinary   Musculoskeletal negative musculoskeletal ROS (+)   Abdominal   Peds negative pediatric ROS (+)  Hematology negative hematology ROS (+)   Anesthesia Other Findings   Reproductive/Obstetrics negative OB ROS                             Anesthesia Physical Anesthesia Plan  ASA: 2  Anesthesia Plan: General   Post-op Pain Management: Toradol IV (intra-op)*   Induction: Intravenous  PONV Risk Score and Plan: 3 and Ondansetron, Dexamethasone, Droperidol, Midazolam and Treatment may vary due to age or medical condition  Airway Management Planned: Oral ETT  Additional Equipment:   Intra-op Plan:   Post-operative Plan: Extubation in OR  Informed Consent: I have reviewed the patients History and Physical, chart, labs and discussed the procedure including the risks, benefits and alternatives for the proposed anesthesia with the patient or authorized representative who has indicated his/her understanding and acceptance.     Dental advisory given  Plan Discussed with: CRNA and Surgeon  Anesthesia Plan Comments:        Anesthesia Quick Evaluation

## 2021-11-15 ENCOUNTER — Encounter (HOSPITAL_COMMUNITY): Payer: Self-pay | Admitting: Obstetrics and Gynecology

## 2021-11-15 LAB — CBC
HCT: 28.3 % — ABNORMAL LOW (ref 36.0–46.0)
Hemoglobin: 9.2 g/dL — ABNORMAL LOW (ref 12.0–15.0)
MCH: 28.3 pg (ref 26.0–34.0)
MCHC: 32.5 g/dL (ref 30.0–36.0)
MCV: 87.1 fL (ref 80.0–100.0)
Platelets: 211 10*3/uL (ref 150–400)
RBC: 3.25 MIL/uL — ABNORMAL LOW (ref 3.87–5.11)
RDW: 13.9 % (ref 11.5–15.5)
WBC: 17.7 10*3/uL — ABNORMAL HIGH (ref 4.0–10.5)
nRBC: 0 % (ref 0.0–0.2)

## 2021-11-15 LAB — SURGICAL PATHOLOGY

## 2021-11-15 MED ORDER — PROPOFOL 1000 MG/100ML IV EMUL
INTRAVENOUS | Status: AC
  Filled 2021-11-15: qty 300

## 2021-11-15 NOTE — Progress Notes (Signed)
  Transition of Care [facility]) Screening Note   Patient Details  Name: Joanna Smith Date of Birth: 03/09/1986   [facility]) CM/SW Contact:    Pollie Friar, RN Phone Number: 11/15/2021, 2:34 PM   Pt is home with spouse. She is s/p ABDOMINAL MYOMECTOMY CHROMOPERTUBATION EXPLORATORY LAPAROTOMY Transition of Care Department (TOC) has reviewed patient. We will continue to monitor patient advancement through interdisciplinary progression rounds. If new patient transition needs arise, please place a TOC consult.

## 2021-11-15 NOTE — Progress Notes (Signed)
1 Day Post-Op Procedure(s): ABDOMINAL MYOMECTOMY CHROMOPERTUBATION EXPLORATORY LAPAROTOMY  Subjective: Patient reports nausea, vomiting, and incisional pain.  Has not tried to eat today.  Had some n/v last evening.   Objective: BP 110/71 (BP Location: Left Arm)   Pulse 68   Temp 98.4 F (36.9 C) (Oral)   Resp 18   Ht '5\' 2"'$  (1.575 m)   Wt 78.5 kg   LMP 10/14/2021 (Approximate)   SpO2 100%   BMI 31.64 kg/m   General: alert and cooperative Resp: clear to auscultation bilaterally Cardio: regular rate and rhythm GI: soft, non-tender; bowel sounds normal; no masses,  no organomegaly and incision: clean, dry, and intact Extremities: Homans sign is negative, no sign of DVT Vaginal Bleeding: none  Assessment: s/p Procedure(s): ABDOMINAL MYOMECTOMY CHROMOPERTUBATION EXPLORATORY LAPAROTOMY: stable  Plan: Advance diet Encourage ambulation Once tolerating po dc foley , heplock iv  LOS: 1 day    Chamberlain Steinborn A Jaiyana Canale 11/15/2021, 1:11 PM

## 2021-11-16 MED ORDER — OXYCODONE HCL 5 MG PO TABS: 5.0000 mg | ORAL_TABLET | Freq: Four times a day (QID) | ORAL | 0 refills | Status: AC | PRN

## 2021-11-16 MED ORDER — METOCLOPRAMIDE HCL 10 MG PO TABS: 10.0000 mg | ORAL_TABLET | Freq: Three times a day (TID) | ORAL | 0 refills | Status: AC

## 2021-11-16 MED ORDER — ONDANSETRON HCL 4 MG PO TABS: 4.0000 mg | ORAL_TABLET | Freq: Four times a day (QID) | ORAL | 0 refills | Status: DC | PRN

## 2021-11-16 NOTE — Discharge Summary (Signed)
Physician Discharge Summary  Patient ID: Joanna Smith MRN: [id number] DOB/AGE: 1985/06/24 36 y.o.  Admit date: 11/14/2021 Discharge date: 11/16/2021  Admission Diagnoses:  Discharge Diagnoses:  Principal Problem:   Leiomyoma of uterus Active Problems:   S/P myomectomy   Discharged Condition: good  Hospital Course: Pt underwent an abdominal myomectomy.  She is toleratingPO and passing flatus.  She has benefitted from her hospital stay  Consults: general surgery she has a rectal cyst and has been referred for eval   Significant Diagnostic Studies: pathology  Treatments: IV hydration and surgery: see above  Discharge Exam: Blood pressure (!) 109/49, pulse 81, temperature 99.3 F (37.4 C), temperature source Oral, resp. rate 16, height '5\' 2"'$  (1.575 m), weight 78.5 kg, last menstrual period 10/14/2021, SpO2 90 %. General appearance: alert and cooperative Resp: clear to auscultation bilaterally GI: soft, non-tender; bowel sounds normal; no masses,  no organomegaly Pelvic: no vaginal bleeding Extremities: Homans sign is negative, no sign of DVT  Disposition: Discharge disposition: 01-Home or Self Care        Allergies as of 11/16/2021       Reactions   Macadamia Nut Oil Anaphylaxis        Medication List     STOP taking these medications    doxycycline 100 MG capsule Commonly known as: VIBRAMYCIN   metroNIDAZOLE 500 MG tablet Commonly known as: FLAGYL       TAKE these medications    acetaminophen 500 MG tablet Commonly known as: TYLENOL Take 1,000 mg by mouth every 6 (six) hours as needed for moderate pain or headache.   Clear Eyes Contact Lens Relief Soln Place 1-2 drops into both eyes daily as needed (irritation.).   ibuprofen 200 MG tablet Commonly known as: ADVIL Take 200 mg by mouth every 6 (six) hours as needed for headache or moderate pain.   Linzess 290 MCG Caps capsule Generic drug: linaclotide Take 290 mcg by mouth every morning.    metoCLOPramide 10 MG tablet Commonly known as: REGLAN Take 1 tablet (10 mg total) by mouth 4 (four) times daily -  before meals and at bedtime for 7 days.   ondansetron 4 MG tablet Commonly known as: ZOFRAN Take 1 tablet (4 mg total) by mouth every 6 (six) hours as needed for nausea. What changed:  when to take this reasons to take this   oxyCODONE 5 MG immediate release tablet Commonly known as: Oxy IR/ROXICODONE Take 1-2 tablets (5-10 mg total) by mouth every 6 (six) hours as needed for moderate pain.        Follow-up Information     Dov Dill, MD Follow up in 1 week(s).   Specialty: Obstetrics and Gynecology Contact information: [address] [place] [address] [phone]                 Signed: Betsy Coder 11/16/2021, 9:18 AM

## 2021-11-16 NOTE — Progress Notes (Signed)
Virgie Dad be D/C'd per MD order. Discussed with the patient and all questions fully answered. ? IV catheter discontinued intact. Site without signs and symptoms of complications. Dressing and pressure applied. ? An After Visit Summary was printed and given to the patient. Patient informed where to pickup prescriptions. ? D/c education completed with patient/family including follow up instructions, medication list, d/c activities limitations if indicated, with other d/c instructions as indicated by MD - patient able to verbalize understanding, all questions fully answered.  ? Patient instructed to return to ED, call 911, or call MD for any changes in condition.  ? Patient to be escorted via [place], and D/C home via private auto.

## 2022-08-27 ENCOUNTER — Emergency Department (HOSPITAL_COMMUNITY)
Admission: EM | Admit: 2022-08-27 | Discharge: 2022-08-27 | Disposition: A | Discharge status: Home / Self Care | Payer: BC Managed Care – PPO | Attending: Emergency Medicine | Admitting: Emergency Medicine

## 2022-08-27 ENCOUNTER — Other Ambulatory Visit: Payer: Self-pay

## 2022-08-27 ENCOUNTER — Encounter (HOSPITAL_COMMUNITY): Payer: Self-pay | Admitting: Emergency Medicine

## 2022-08-27 ENCOUNTER — Emergency Department (HOSPITAL_COMMUNITY): Payer: BC Managed Care – PPO

## 2022-08-27 DIAGNOSIS — R109 Unspecified abdominal pain: Secondary | ICD-10-CM | POA: Diagnosis present

## 2022-08-27 DIAGNOSIS — Z85040 Personal history of malignant carcinoid tumor of rectum: Secondary | ICD-10-CM | POA: Insufficient documentation

## 2022-08-27 DIAGNOSIS — K5900 Constipation, unspecified: Secondary | ICD-10-CM | POA: Diagnosis not present

## 2022-08-27 LAB — CBC
HCT: 39.2 % (ref 36.0–46.0)
Hemoglobin: 12.1 g/dL (ref 12.0–15.0)
MCH: 26.4 pg (ref 26.0–34.0)
MCHC: 30.9 g/dL (ref 30.0–36.0)
MCV: 85.4 fL (ref 80.0–100.0)
Platelets: 392 10*3/uL (ref 150–400)
RBC: 4.59 MIL/uL (ref 3.87–5.11)
RDW: 15.2 % (ref 11.5–15.5)
WBC: 9.5 10*3/uL (ref 4.0–10.5)
nRBC: 0 % (ref 0.0–0.2)

## 2022-08-27 LAB — COMPREHENSIVE METABOLIC PANEL
ALT: 12 U/L (ref 0–44)
AST: 15 U/L (ref 15–41)
Albumin: 3.7 g/dL (ref 3.5–5.0)
Alkaline Phosphatase: 71 U/L (ref 38–126)
Anion gap: 9 (ref 5–15)
BUN: 7 mg/dL (ref 6–20)
CO2: 25 mmol/L (ref 22–32)
Calcium: 8.2 mg/dL — ABNORMAL LOW (ref 8.9–10.3)
Chloride: 104 mmol/L (ref 98–111)
Creatinine, Ser: 0.88 mg/dL (ref 0.44–1.00)
GFR, Estimated: >60 mL/min (ref >60)
Glucose, Bld: 113 mg/dL — ABNORMAL HIGH (ref 70–99)
Potassium: 3.4 mmol/L — ABNORMAL LOW (ref 3.5–5.1)
Sodium: 138 mmol/L (ref 135–145)
Total Bilirubin: 0.5 mg/dL (ref 0.3–1.2)
Total Protein: 7 g/dL (ref 6.5–8.1)

## 2022-08-27 LAB — I-STAT BETA HCG BLOOD, ED (MC, WL, AP ONLY): I-stat hCG, quantitative: <5 m[IU]/mL (ref <5)

## 2022-08-27 LAB — LIPASE, BLOOD: Lipase: 32 U/L (ref 11–51)

## 2022-08-27 MED ORDER — IOHEXOL 350 MG/ML SOLN
75.0000 mL | Freq: Once | INTRAVENOUS | Status: AC | PRN
  Administered 2022-08-27: 75 mL via INTRAVENOUS

## 2022-08-27 MED ORDER — FENTANYL CITRATE PF 50 MCG/ML IJ SOSY
100.0000 ug | PREFILLED_SYRINGE | Freq: Once | INTRAMUSCULAR | Status: AC
  Administered 2022-08-27: 100 ug via INTRAVENOUS
  Filled 2022-08-27: qty 2

## 2022-08-27 MED ORDER — ONDANSETRON HCL 4 MG/2ML IJ SOLN
4.0000 mg | Freq: Once | INTRAMUSCULAR | Status: AC
  Administered 2022-08-27: 4 mg via INTRAVENOUS
  Filled 2022-08-27: qty 2

## 2022-08-27 MED ORDER — MILK AND MOLASSES ENEMA
1.0000 | Freq: Once | RECTAL | Status: AC
  Administered 2022-08-27: 240 mL via RECTAL
  Filled 2022-08-27: qty 240

## 2022-08-27 NOTE — ED Triage Notes (Signed)
Pt in with rectal pain, constipation. Pt has hx of presacral mass with removal and pt had recent ileostomy reversal on 5/15 by Dr. Luciano Cutter. Pt states she has been using multiple laxatives and stool softeners for constipation but only had a small amount of stool yesterday. Tearful in triage, reports severe abdominal pain and bloating at this time.

## 2022-08-27 NOTE — Discharge Instructions (Addendum)
Your lab workup and imaging are reassuring, I would like you to continue with the laxatives that were prescribed to you, it is important that you stay hydrated, please drink plenty of water as these laxatives only work if you are hydrated.  Please eat foods that are high in fiber like legumes, leafy greens, whole grains, and please remember to exercise this will help stimulate bowel movements.  Please follow-up with your general surgeon for further evaluation.  Come back to the emergency department if you develop chest pain, shortness of breath, severe abdominal pain, uncontrolled nausea, vomiting, diarrhea.

## 2022-08-27 NOTE — ED Provider Notes (Signed)
[place] EMERGENCY DEPARTMENT AT [facility] Provider Note   CSN: [id number] Arrival date & time: 08/27/22  0024     History  Chief Complaint  Patient presents with   Constipation   Abdominal Pain    Joanna Smith is a 37 y.o. female.  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  Patient states that since the ileostomy takedown she has been unable to have much of a bowel movement, states she is only passed a very small amount of stool, she has she been taking laxative without much relief.  She says she has severe amount of pain at her rectum and feels like there is a large piece of stool stuck there, states she has liquid stool draining from her rectum..  She has not had associated nausea without any vomiting no urinary symptoms, no associated fevers chills cough congestion or chest pain or shortness of breath.  States that she does not have any active drainage or discharge around the ileostomy site.  Reviewed patient's chart has been followed and managed at [facility], patient's surgeon is Dr. Rosalyn Charters whom performed ileus takedown on 05/15.  Home Medications Prior to Admission medications   Medication Sig Start Date End Date Taking? Authorizing Provider  acetaminophen (TYLENOL) 500 MG tablet Take 1,000 mg by mouth every 6 (six) hours as needed for moderate pain or headache.    [provider]  ibuprofen (ADVIL) 200 MG tablet Take 200 mg by mouth every 6 (six) hours as needed for headache or moderate pain.    [provider]  LINZESS 290 MCG CAPS capsule Take 290 mcg by mouth every morning. 09/20/21   [provider]  metoCLOPramide (REGLAN) 10 MG tablet Take  1 tablet (10 mg total) by mouth 4 (four) times daily -  before meals and at bedtime for 7 days. 11/16/21 11/23/21  Jaymes Graff, MD  ondansetron (ZOFRAN) 4 MG tablet Take 1 tablet (4 mg total) by mouth every 6 (six) hours as needed for nausea. 11/16/21   Jaymes Graff, MD  oxyCODONE (OXY IR/ROXICODONE) 5 MG immediate release tablet Take 1-2 tablets (5-10 mg total) by mouth every 6 (six) hours as needed for moderate pain. 11/16/21   Jaymes Graff, MD  Soft Lens Products (CLEAR EYES CONTACT LENS RELIEF) SOLN Place 1-2 drops into both eyes daily as needed (irritation.).    [provider]      Allergies    Macadamia nut oil    Review of Systems   Review of Systems  Constitutional:  Negative for chills and fever.  Respiratory:  Negative for shortness of breath.   Cardiovascular:  Negative for chest pain.  Gastrointestinal:  Positive for abdominal distention, abdominal pain, constipation, nausea and rectal pain. Negative for vomiting.  Neurological:  Negative for headaches.    Physical Exam Updated Vital Signs BP 123/83 (BP Location: Right Arm)   Pulse 72   Temp 98.6 F (37 C) (Oral)   Resp 14   Wt 79.4 kg   LMP 08/16/2022   SpO2 99%   BMI 32.01 kg/m  Physical Exam Vitals and nursing note reviewed. Exam conducted with a chaperone present.  Constitutional:      General: She is not in acute distress.    Appearance: She  is not ill-appearing.  HENT:     Head: Normocephalic and atraumatic.     Nose: No congestion.  Eyes:     Conjunctiva/sclera: Conjunctivae normal.  Cardiovascular:     Rate and Rhythm: Normal rate and regular rhythm.     Pulses: Normal pulses.     Heart sounds: No murmur heard.    No friction rub. No gallop.  Pulmonary:     Effort: No respiratory distress.     Breath sounds: No wheezing, rhonchi or rales.  Abdominal:     General: There is distension.     Palpations: Abdomen is soft.     Tenderness: There is abdominal tenderness. There is no right CVA  tenderness or left CVA tenderness.     Comments: Abdomen is distended, hard, no focalized tenderness.  Ileostomy site was evaluated currently packed without any signs of infection present.  Genitourinary:    Comments: With chaperone present rectum was visualized no external hemorrhoids no drainage or discharge present, digital rectal exam was performed there is a noted stool burden the distal end of the rectum, able to break up the stool.  No noted hematochezia or dark tarry stool.  No palpable mass within the rectum. Skin:    General: Skin is warm and dry.  Neurological:     Mental Status: She is alert.  Psychiatric:        Mood and Affect: Mood normal.     ED Results / Procedures / Treatments   Labs (all labs ordered are listed, but only abnormal results are displayed) Labs Reviewed  COMPREHENSIVE METABOLIC PANEL - Abnormal; Notable for the following components:      Result Value   Potassium 3.4 (*)    Glucose, Bld 113 (*)    Calcium 8.2 (*)    All other components within normal limits  LIPASE, BLOOD  CBC  URINALYSIS, ROUTINE W REFLEX MICROSCOPIC  I-STAT BETA HCG BLOOD, ED ([facility], [facility], [facility] ONLY)    EKG None  Radiology CT ABDOMEN PELVIS W CONTRAST  Result Date: 08/27/2022 CLINICAL DATA:  Abdominal pain, bloating and rectal pain. On 11/14/2021 underwent laparotomy with myomectomy. Most recently has undergone ileostomy reversal May 15. EXAM: CT ABDOMEN AND PELVIS WITH CONTRAST TECHNIQUE: Multidetector CT imaging of the abdomen and pelvis was performed using the standard protocol following bolus administration of intravenous contrast. RADIATION DOSE REDUCTION: This exam was performed according to the departmental dose-optimization program which includes automated exposure control, adjustment of the mA and/or kV according to patient size and/or use of iterative reconstruction technique. CONTRAST:  75mL OMNIPAQUE IOHEXOL 350 MG/ML SOLN COMPARISON:  CT with IV contrast 07/18/2021 and  02/18/2019. FINDINGS: Lower chest: No acute abnormality. Hepatobiliary: No focal liver abnormality is seen. The liver is 19 cm length. No calcified gallstones, gallbladder wall thickening, or biliary dilatation. Pancreas: No abnormality. Spleen: No abnormality.  No splenomegaly. Adrenals/Urinary Tract: Adrenal glands are unremarkable. Kidneys are normal, without renal calculi, focal lesion, or hydronephrosis. Bladder is unremarkable. Stomach/Bowel: The gastric wall, unopacified small bowel, and appendix are unremarkable with exception of a surgically widened small bowel segment newly noted in the infraumbilical region anteriorly in the midline. All other unopacified small bowel segments are normal in caliber. There is fluid intermixed with semisolid stool mildly distending the large bowel to the rectosigmoid junction, with formed stool distending the rectum. There could be a small impaction of feces in the distal rectum where the stool appears more solid than elsewhere. No thickening of the colonic wall is  seen. A 2.5 cm wide, 1.3 cm in depth superficial open wound is noted in the right mid abdomen and is probably the location of the prior ileostomy, with mild underlying stranding. There is mild mesenteric edema in general, more focal edema seen around the small bowel surgical anastomosis described above. The edema could be normal partially resolved postoperative edema or could have an inflammatory basis such as peritonitis. Vascular/Lymphatic: No significant vascular findings are present. No enlarged abdominal or pelvic lymph nodes. Reproductive: The 2 large leiomyomas of the distal uterus noted previously have been removed in the interval. The ovaries are follicular but are not enlarged. The uterus has a more normal size and appearance than previously. Other: Small amount of nonlocalizing pelvic ascites is seen, primarily in the adnexa and cul-de-sac areas. There is no free air, free hemorrhage or localizing  collection such as an abscess. There is a small umbilical fat hernia. No incarcerated hernia. There are left perirectal surgical clips in the location of the previous multiloculated left perirectal abscess. There are coarse scar-like changes in the fat posterior to the rectum but I do not see a recurrent abscess. Multiple pelvic phleboliths. Musculoskeletal: No acute or significant osseous findings. IMPRESSION: 1. Fluid intermixed with semisolid stool mildly distending the large bowel to the rectosigmoid junction, with formed stool distending the rectum. There could be a small impaction of feces in the distal rectum where the stool appears more solid than elsewhere. 2. There is mild mesenteric edema in general, with more focal edema seen around the newly noted infraumbilical small bowel surgical anastomosis. This could be normal partially resolved postoperative edema or could have an inflammatory basis such as peritonitis. 3. Small amount of nonlocalizing pelvic ascites, nonspecific. No recurrent perirectal abscess is seen. 4. 2.5 x 1.3 cm superficial open wound in the right mid abdomen and is probably the location of the prior ileostomy, with mild underlying stranding. 5. No free air, free hemorrhage or localizing collection. 6. Interval resection of the 2 large distal uterine leiomyomas. 7. Small umbilical fat hernia. Electronically Signed   By: Almira Bar M.D.   On: 08/27/2022 03:50    Procedures Fecal disimpaction  Date/Time: 08/27/2022 4:54 AM  Performed by: Carroll Sage, PA-C Authorized by: Carroll Sage, PA-C  Consent: Verbal consent obtained. Risks and benefits: risks, benefits and alternatives were discussed Consent given by: patient Required items: required blood products, implants, devices, and special equipment available Patient identity confirmed: verbally with patient Time out: Immediately prior to procedure a "time out" was called to verify the correct patient, procedure,  equipment, support staff and site/side marked as required. Preparation: Patient was prepped and draped in the usual sterile fashion. Local anesthesia used: no  Anesthesia: Local anesthesia used: no  Sedation: Patient sedated: no  Patient tolerance: patient tolerated the procedure well with no immediate complications       Medications Ordered in ED Medications  iohexol (OMNIPAQUE) 350 MG/ML injection 75 mL (75 mLs Intravenous Contrast Given 08/27/22 0317)  fentaNYL (SUBLIMAZE) injection 100 mcg (100 mcg Intravenous Given 08/27/22 0426)  fentaNYL (SUBLIMAZE) injection 100 mcg (100 mcg Intravenous Given 08/27/22 0529)  milk and molasses enema (240 mLs Rectal Given 08/27/22 0530)  ondansetron (ZOFRAN) injection 4 mg (4 mg Intravenous Given 08/27/22 0620)    ED Course/ Medical Decision Making/ A&P                             Medical Decision  Making Amount and/or Complexity of Data Reviewed Labs: ordered. Radiology: ordered.  Risk Prescription drug management.   This patient presents to the ED for concern of constipation, this involves an extensive number of treatment options, and is a complaint that carries with it a high risk of complications and morbidity.  The differential diagnosis includes bowel obstruction, volvulus, intra-abdominal infection,    Additional history obtained:  Additional history obtained from partner at bedside External records from outside source obtained and reviewed including general surgery notes   Co morbidities that complicate the patient evaluation  Status post ileostomy takedown  Social Determinants of Health:  N/A    Lab Tests:  I Ordered, and personally interpreted labs.  The pertinent results include: CBC unremarkable, CMP reveals potassium 3.4, glucose 113, calcium 8.2, lipase 32, hCG less than 5   Imaging Studies ordered:  I ordered imaging studies including CT abdomen pelvis I independently visualized and interpreted imaging  which showed fecal impaction in the distal rectum, some edema present within the infraumbilical region, no other acute abnormalities I agree with the radiologist interpretation   Cardiac Monitoring:  The patient was maintained on a cardiac monitor.  I personally viewed and interpreted the cardiac monitored which showed an underlying rhythm of: N/A   Medicines ordered and prescription drug management:  I ordered medication including fentanyl I have reviewed the patients home medicines and have made adjustments as needed  Critical Interventions:  N/A   Reevaluation:  Present with constipation, triage obtain basic lab workup which I personally viewed is unremarkable, on my exam patient has a distended abdomen which was hard, due to her recent surgical procedure I am concerned for possible postop complications, will send down for CT on pelvis for further evaluation.  Reassessed the patient updated on imaging, I recommended fecal disimpaction, patient was in agreement with this, will provide analgesics and disimpact.  Able to disimpact most of the fecal impaction minutes, we will provide enema.  Patient had a large bowel movement, reassessed patient's abdomen and soft nontender, she is agreement discharge at this time.   Consultations Obtained:  N/a    Test Considered:  N/a    Rule out Suspicion for intra-abdominal abscess, bowel obstruction, volvulus, diverticulitis, perirectal abscess, fistula, slight this time CT imaging is all negative these findings.  I doubt pancreatitis lipase within normal limits, I doubt cholecystitis or cholangitis liver signs alk phos T. bili all within normal limits no leukocytosis.  Does know that CT imaging reveals concerns of possible peritonitis but my suspicion this is very low at this time as her abdomen is soft nontender she is nontoxic-appearing no leukocytosis, I suspect this is postsurgical changes from the ileostomy  reversal.   Dispostion and problem list  After consideration of the diagnostic results and the patients response to treatment, I feel that the patent would benefit from discharge.  Constipation-suspect this is complication of the ileostomy reversal, will have her continue with the bowel regiment that we will provide her by her general surgeon given her strict return precautions.            Final Clinical Impression(s) / ED Diagnoses Final diagnoses:  Constipation, unspecified constipation type    Rx / DC Orders ED Discharge Orders     None         Carroll Sage, PA-C 08/27/22 [id number]    Gilda Crease, MD 08/27/22 [id number]

## 2022-12-31 ENCOUNTER — Emergency Department (HOSPITAL_COMMUNITY)
Admission: EM | Admit: 2022-12-31 | Discharge: 2022-12-31 | Discharge status: Against Medical Advice | Payer: BC Managed Care – PPO | Attending: Emergency Medicine | Admitting: Emergency Medicine

## 2022-12-31 ENCOUNTER — Other Ambulatory Visit: Payer: Self-pay

## 2022-12-31 DIAGNOSIS — Z5321 Procedure and treatment not carried out due to patient leaving prior to being seen by health care provider: Secondary | ICD-10-CM | POA: Insufficient documentation

## 2022-12-31 DIAGNOSIS — R109 Unspecified abdominal pain: Secondary | ICD-10-CM | POA: Diagnosis present

## 2022-12-31 DIAGNOSIS — K5900 Constipation, unspecified: Secondary | ICD-10-CM | POA: Insufficient documentation

## 2022-12-31 NOTE — ED Triage Notes (Signed)
Pt arrives to ED constipation x 2 weeks. Pt is able to pass flautus but has not had bowel movement. Pt with previous ABD hx with tumor removal x 1 year ago. Pt with ostomy bag removed in May of this year. Pt recently seen for fecal disimpaction

## 2024-02-04 NOTE — ED Provider Notes (Signed)
11:05 PM: Assumed care of pt from outgoing team. For full details, see prior team's note from today.  In brief, the patient is a 38 year old female who is otherwise healthy and in 2023 had pre sacral tumor excision along with prior ileostomy. She has a non healing chronic wound above the rectum, drainage. Began draining in Aug. Apt in Jan. Blood from wound new. New pain there and lower abdomen. Nausea. Chronic urinary retention. No active bleeding. Closed wound.  Outstanding studies and c/s:  - Pending CTAP  Plan:  - Patient is having persistent pain and concern of new bleeding. CT scan reviewed and general surgery has been consulted.  Initial Vital signs: BP 112/67   Pulse 72   Temp 36.4 C (97.5 F) (Tympanic)   Resp 12   Wt 85.3 kg (188 lb)   LMP 01/11/2024 (Exact Date)   SpO2 96%   BMI 34.38 kg/m   Pertinent exam findings/studies:  Labs Reviewed  COMPLETE BLOOD COUNT (CBC) WITH DIFFERENTIAL - Abnormal; Notable for the following components:      Result Value   WBC (White Blood Cell Count) 10.2 (*)    MCH (Mean Corpuscular Hemoglobin) 26.1 (*)    MCHC (Mean Corpuscular Hemoglobin Concentration) 30.9 (*)    RDW-CV (Red Cell Distribution Width) 14.6 (*)    All other components within normal limits  HCG BLOOD QUANTITATIVE, PREGNANCY TEST   Narrative:    Interpretive Data :                                              Adult Female:                 <5     mIU/mL                            Adult Female:  Negative       <5     mIU/mL                           Indeterminate  5-25   mIU/mL                           Positive       >25    mIU/mL                            TROPONIN I, HIGH SENSITIVITY ED EVALUATION  LACTATE, PLASMA (LACTIC ACID) VENOUS  COMPREHENSIVE METABOLIC PANEL (CMP)  TROPONIN I, HIGH SENSITIVITY ONE HOUR (LO)    Updates and Interventions:  ED Course as of 02/05/24 0613  Thu Feb 05, 2024  0316 CT abdomen pelvis with contrast No acute findings in the abdomen  or pelvis.   Right corpus luteum.   Similar soft tissue thickening along the posterior margin of the rectum extending to the gluteal cleft. No drainable fluid collection.   [SB]  [id number] Paged general surgery for update on plan [SB]    ED Course User Index [SB] Brief, Lacinda Norris, MD    Disposition: Consulted general surgery. Pending general surgery recommendations. Signed out to oncoming resident.  ___________________________ Lacinda Mast, MD Emergency Medicine    Brief, Lacinda Norris, MD Resident  02/05/24 0655  

## 2024-02-05 NOTE — ED Notes (Addendum)
 Assumed care of pt at this time. Received report from off-going RN. Pt here with concern for chronic sacral wound. Awaiting surgery recs at this time. Pt resting in bed, ABCs intact, NAD. Call bell within reach.

## 2024-02-05 NOTE — ED Notes (Signed)
 ED provider at bedside at this time updating patient on plan of care.

## 2024-02-05 NOTE — ED Notes (Signed)
 Patient resting in bed. Respirations even and unlabored. No needs expressed to RN at this time.

## 2024-02-05 NOTE — ED Notes (Signed)
 Patient transported to CT

## 2024-03-01 ENCOUNTER — Encounter (HOSPITAL_BASED_OUTPATIENT_CLINIC_OR_DEPARTMENT_OTHER): Admitting: General Surgery
